# Patient Record
Sex: Female | Born: 1955 | Race: White | Hispanic: No | Smoking: Never smoker
Health system: Southern US, Community
[De-identification: ages and names within clinical notes are randomized; demographics above are authoritative.]

## PROBLEM LIST (undated history)

## (undated) DIAGNOSIS — Z87891 Personal history of nicotine dependence: Secondary | ICD-10-CM

## (undated) DIAGNOSIS — I82409 Acute embolism and thrombosis of unspecified deep veins of unspecified lower extremity: Secondary | ICD-10-CM

## (undated) DIAGNOSIS — I10 Essential (primary) hypertension: Secondary | ICD-10-CM

## (undated) DIAGNOSIS — H539 Unspecified visual disturbance: Secondary | ICD-10-CM

## (undated) DIAGNOSIS — I2510 Atherosclerotic heart disease of native coronary artery without angina pectoris: Secondary | ICD-10-CM

## (undated) DIAGNOSIS — E785 Hyperlipidemia, unspecified: Secondary | ICD-10-CM

## (undated) DIAGNOSIS — Z91199 Patient's noncompliance with other medical treatment and regimen due to unspecified reason: Secondary | ICD-10-CM

## (undated) DIAGNOSIS — D6859 Other primary thrombophilia: Secondary | ICD-10-CM

## (undated) DIAGNOSIS — Z9081 Acquired absence of spleen: Secondary | ICD-10-CM

## (undated) DIAGNOSIS — K316 Fistula of stomach and duodenum: Secondary | ICD-10-CM

## (undated) DIAGNOSIS — K5090 Crohn's disease, unspecified, without complications: Secondary | ICD-10-CM

## (undated) DIAGNOSIS — F331 Major depressive disorder, recurrent, moderate: Secondary | ICD-10-CM

## (undated) DIAGNOSIS — K219 Gastro-esophageal reflux disease without esophagitis: Secondary | ICD-10-CM

## (undated) DIAGNOSIS — Z8619 Personal history of other infectious and parasitic diseases: Secondary | ICD-10-CM

## (undated) DIAGNOSIS — N183 Chronic kidney disease, stage 3 unspecified: Secondary | ICD-10-CM

## (undated) DIAGNOSIS — Z8701 Personal history of pneumonia (recurrent): Secondary | ICD-10-CM

## (undated) DIAGNOSIS — E079 Disorder of thyroid, unspecified: Secondary | ICD-10-CM

## (undated) DIAGNOSIS — Z90710 Acquired absence of both cervix and uterus: Secondary | ICD-10-CM

## (undated) DIAGNOSIS — D509 Iron deficiency anemia, unspecified: Secondary | ICD-10-CM

## (undated) DIAGNOSIS — E039 Hypothyroidism, unspecified: Secondary | ICD-10-CM

## (undated) DIAGNOSIS — Z8719 Personal history of other diseases of the digestive system: Secondary | ICD-10-CM

## (undated) DIAGNOSIS — Z87898 Personal history of other specified conditions: Secondary | ICD-10-CM

## (undated) DIAGNOSIS — Z86711 Personal history of pulmonary embolism: Secondary | ICD-10-CM

## (undated) DIAGNOSIS — D510 Vitamin B12 deficiency anemia due to intrinsic factor deficiency: Secondary | ICD-10-CM

## (undated) HISTORY — DX: Acquired absence of spleen: Z90.81

## (undated) HISTORY — PX: CHOLECYSTECTOMY OPEN: SUR202

## (undated) HISTORY — DX: Fistula of stomach and duodenum: K31.6

## (undated) HISTORY — PX: VEIN LIGATION AND STRIPPING: SHX2653

## (undated) HISTORY — DX: Iron deficiency anemia, unspecified: D50.9

## (undated) HISTORY — DX: Morbid (severe) obesity due to excess calories: E66.01

## (undated) HISTORY — DX: Essential (primary) hypertension: I10

## (undated) HISTORY — PX: TUBAL LIGATION: SHX77

## (undated) HISTORY — DX: Atherosclerotic heart disease of native coronary artery without angina pectoris: I25.10

## (undated) HISTORY — DX: Personal history of pulmonary embolism: Z86.711

## (undated) HISTORY — PX: SLEEVE GASTROPLASTY: SHX1101

## (undated) HISTORY — DX: Personal history of other diseases of the digestive system: Z87.19

## (undated) HISTORY — DX: Personal history of nicotine dependence: Z87.891

## (undated) HISTORY — PX: IVC FILTER INSERTION: CATH118245

## (undated) HISTORY — DX: Acquired absence of both cervix and uterus: Z90.710

## (undated) HISTORY — DX: Other primary thrombophilia: D68.59

## (undated) HISTORY — DX: Hyperlipidemia, unspecified: E78.5

## (undated) HISTORY — DX: Personal history of other specified conditions: Z87.898

## (undated) HISTORY — DX: Chronic kidney disease, stage 3 unspecified: N18.30

## (undated) HISTORY — DX: Unspecified visual disturbance: H53.9

## (undated) HISTORY — DX: Patient's noncompliance with other medical treatment and regimen due to unspecified reason: Z91.199

## (undated) HISTORY — DX: Personal history of other infectious and parasitic diseases: Z86.19

## (undated) HISTORY — PX: ABDOMINAL HYSTERECTOMY: SHX81

## (undated) HISTORY — PX: VASCULAR SURGERY: SHX849

## (undated) HISTORY — DX: Hypothyroidism, unspecified: E03.9

## (undated) HISTORY — DX: Acute embolism and thrombosis of unspecified deep veins of unspecified lower extremity: I82.409

## (undated) HISTORY — PX: CHOLECYSTECTOMY: SHX55

## (undated) HISTORY — DX: Major depressive disorder, recurrent, moderate: F33.1

## (undated) HISTORY — DX: Personal history of pneumonia (recurrent): Z87.01

## (undated) HISTORY — DX: Vitamin B12 deficiency anemia due to intrinsic factor deficiency: D51.0

---

## 2011-09-03 ENCOUNTER — Emergency Department (HOSPITAL_COMMUNITY)
Admission: EM | Admit: 2011-09-03 | Discharge: 2011-09-03 | Disposition: A | Discharge status: Home / Self Care | Payer: Medicare Other | Attending: Emergency Medicine | Admitting: Emergency Medicine

## 2011-09-03 ENCOUNTER — Emergency Department (HOSPITAL_COMMUNITY): Payer: Medicare Other

## 2011-09-03 DIAGNOSIS — M719 Bursopathy, unspecified: Secondary | ICD-10-CM | POA: Insufficient documentation

## 2011-09-03 DIAGNOSIS — M67919 Unspecified disorder of synovium and tendon, unspecified shoulder: Secondary | ICD-10-CM | POA: Insufficient documentation

## 2011-09-03 DIAGNOSIS — K5090 Crohn's disease, unspecified, without complications: Secondary | ICD-10-CM | POA: Insufficient documentation

## 2011-09-03 DIAGNOSIS — M329 Systemic lupus erythematosus, unspecified: Secondary | ICD-10-CM | POA: Insufficient documentation

## 2011-09-03 DIAGNOSIS — M069 Rheumatoid arthritis, unspecified: Secondary | ICD-10-CM | POA: Insufficient documentation

## 2011-09-03 DIAGNOSIS — M25519 Pain in unspecified shoulder: Secondary | ICD-10-CM | POA: Insufficient documentation

## 2011-10-08 ENCOUNTER — Other Ambulatory Visit: Payer: Self-pay | Admitting: Family Medicine

## 2011-10-08 DIAGNOSIS — R911 Solitary pulmonary nodule: Secondary | ICD-10-CM

## 2011-10-10 ENCOUNTER — Ambulatory Visit
Admission: RE | Admit: 2011-10-10 | Discharge: 2011-10-10 | Disposition: A | Discharge status: Home / Self Care | Payer: Medicare Other | Source: Ambulatory Visit | Attending: Family Medicine | Admitting: Family Medicine

## 2011-10-10 DIAGNOSIS — R911 Solitary pulmonary nodule: Secondary | ICD-10-CM

## 2011-10-10 MED ORDER — IOHEXOL 300 MG/ML  SOLN
75.0000 mL | Freq: Once | INTRAMUSCULAR | Status: AC | PRN
  Administered 2011-10-10: 75 mL via INTRAVENOUS

## 2011-12-02 ENCOUNTER — Other Ambulatory Visit: Payer: Self-pay | Admitting: Gastroenterology

## 2011-12-02 DIAGNOSIS — R197 Diarrhea, unspecified: Secondary | ICD-10-CM

## 2011-12-06 ENCOUNTER — Ambulatory Visit
Admission: RE | Admit: 2011-12-06 | Discharge: 2011-12-06 | Disposition: A | Discharge status: Home / Self Care | Payer: Medicare Other | Source: Ambulatory Visit | Attending: Gastroenterology | Admitting: Gastroenterology

## 2011-12-06 DIAGNOSIS — R197 Diarrhea, unspecified: Secondary | ICD-10-CM

## 2011-12-06 MED ORDER — IOHEXOL 300 MG/ML  SOLN
125.0000 mL | Freq: Once | INTRAMUSCULAR | Status: AC | PRN
  Administered 2011-12-06: 125 mL via INTRAVENOUS

## 2012-04-19 ENCOUNTER — Encounter (HOSPITAL_COMMUNITY): Payer: Self-pay

## 2012-04-19 ENCOUNTER — Emergency Department (HOSPITAL_COMMUNITY): Payer: Medicare Other

## 2012-04-19 ENCOUNTER — Emergency Department (HOSPITAL_COMMUNITY)
Admission: EM | Admit: 2012-04-19 | Discharge: 2012-04-19 | Disposition: A | Discharge status: Home / Self Care | Payer: Medicare Other | Attending: Emergency Medicine | Admitting: Emergency Medicine

## 2012-04-19 DIAGNOSIS — Z79899 Other long term (current) drug therapy: Secondary | ICD-10-CM | POA: Insufficient documentation

## 2012-04-19 DIAGNOSIS — M25562 Pain in left knee: Secondary | ICD-10-CM

## 2012-04-19 DIAGNOSIS — W19XXXA Unspecified fall, initial encounter: Secondary | ICD-10-CM | POA: Insufficient documentation

## 2012-04-19 DIAGNOSIS — E079 Disorder of thyroid, unspecified: Secondary | ICD-10-CM | POA: Insufficient documentation

## 2012-04-19 DIAGNOSIS — K5090 Crohn's disease, unspecified, without complications: Secondary | ICD-10-CM | POA: Insufficient documentation

## 2012-04-19 DIAGNOSIS — K219 Gastro-esophageal reflux disease without esophagitis: Secondary | ICD-10-CM | POA: Insufficient documentation

## 2012-04-19 DIAGNOSIS — S8990XA Unspecified injury of unspecified lower leg, initial encounter: Secondary | ICD-10-CM | POA: Insufficient documentation

## 2012-04-19 HISTORY — DX: Crohn's disease, unspecified, without complications: K50.90

## 2012-04-19 HISTORY — DX: Gastro-esophageal reflux disease without esophagitis: K21.9

## 2012-04-19 HISTORY — DX: Disorder of thyroid, unspecified: E07.9

## 2012-04-19 MED ORDER — TRAMADOL HCL 50 MG PO TABS: 50.0000 mg | ORAL_TABLET | Freq: Four times a day (QID) | ORAL | Status: AC | PRN

## 2012-04-19 NOTE — ED Notes (Signed)
Pt in from home with c/o left knee pain states injured several weeks ago states pain has increased worse on movement

## 2012-04-19 NOTE — ED Notes (Signed)
Pt alert, nad, c/o left knee pain, onset several months ago, states resulted from a slip/fall injury, seen PCP, instructed to f/u with Orthopedist, denies recent trauma or injury, resp even unlabored, skin pwd

## 2012-04-19 NOTE — Discharge Instructions (Signed)
Your x-ray shows some arthritis. I think if you pain continues, you must follow up with orthopedics for further evaluation. Use crutches as needed. Ice your knee several times a day. Ultram for pain. Call as referred for orthopedics appointment.   Knee Pain The knee is the complex joint between your thigh and your lower leg. It is made up of bones, tendons, ligaments, and cartilage. The bones that make up the knee are:  The femur in the thigh.   The tibia and fibula in the lower leg.   The patella or kneecap riding in the groove on the lower femur.  CAUSES  Knee pain is a common complaint with many causes. A few of these causes are:  Injury, such as:   A ruptured ligament or tendon injury.   Torn cartilage.   Medical conditions, such as:   Gout   Arthritis   Infections   Overuse, over training or overdoing a physical activity.  Knee pain can be minor or severe. Knee pain can accompany debilitating injury. Minor knee problems often respond well to self-care measures or get well on their own. More serious injuries may need medical intervention or even surgery. SYMPTOMS The knee is complex. Symptoms of knee problems can vary widely. Some of the problems are:  Pain with movement and weight bearing.   Swelling and tenderness.   Buckling of the knee.   Inability to straighten or extend your knee.   Your knee locks and you cannot straighten it.   Warmth and redness with pain and fever.   Deformity or dislocation of the kneecap.  DIAGNOSIS  Determining what is wrong may be very straight forward such as when there is an injury. It can also be challenging because of the complexity of the knee. Tests to make a diagnosis may include:  Your caregiver taking a history and doing a physical exam.   Routine X-rays can be used to rule out other problems. X-rays will not reveal a cartilage tear. Some injuries of the knee can be diagnosed by:   Arthroscopy a surgical technique by  which a small video camera is inserted through tiny incisions on the sides of the knee. This procedure is used to examine and repair internal knee joint problems. Tiny instruments can be used during arthroscopy to repair the torn knee cartilage (meniscus).   Arthrography is a radiology technique. A contrast liquid is directly injected into the knee joint. Internal structures of the knee joint then become visible on X-ray film.   An MRI scan is a non x-ray radiology procedure in which magnetic fields and a computer produce two- or three-dimensional images of the inside of the knee. Cartilage tears are often visible using an MRI scanner. MRI scans have largely replaced arthrography in diagnosing cartilage tears of the knee.   Blood work.   Examination of the fluid that helps to lubricate the knee joint (synovial fluid). This is done by taking a sample out using a needle and a syringe.  TREATMENT The treatment of knee problems depends on the cause. Some of these treatments are:  Depending on the injury, proper casting, splinting, surgery or physical therapy care will be needed.   Give yourself adequate recovery time. Do not overuse your joints. If you begin to get sore during workout routines, back off. Slow down or do fewer repetitions.   For repetitive activities such as cycling or running, maintain your strength and nutrition.   Alternate muscle groups. For example if you are  a weight lifter, work the upper body on one day and the lower body the next.   Either tight or weak muscles do not give the proper support for your knee. Tight or weak muscles do not absorb the stress placed on the knee joint. Keep the muscles surrounding the knee strong.   Take care of mechanical problems.   If you have flat feet, orthotics or special shoes may help. See your caregiver if you need help.   Arch supports, sometimes with wedges on the inner or outer aspect of the heel, can help. These can shift pressure  away from the side of the knee most bothered by osteoarthritis.   A brace called an "unloader" brace also may be used to help ease the pressure on the most arthritic side of the knee.   If your caregiver has prescribed crutches, braces, wraps or ice, use as directed. The acronym for this is PRICE. This means protection, rest, ice, compression and elevation.   Nonsteroidal anti-inflammatory drugs (NSAID's), can help relieve pain. But if taken immediately after an injury, they may actually increase swelling. Take NSAID's with food in your stomach. Stop them if you develop stomach problems. Do not take these if you have a history of ulcers, stomach pain or bleeding from the bowel. Do not take without your caregiver's approval if you have problems with fluid retention, heart failure, or kidney problems.   For ongoing knee problems, physical therapy may be helpful.   Glucosamine and chondroitin are over-the-counter dietary supplements. Both may help relieve the pain of osteoarthritis in the knee. These medicines are different from the usual anti-inflammatory drugs. Glucosamine may decrease the rate of cartilage destruction.   Injections of a corticosteroid drug into your knee joint may help reduce the symptoms of an arthritis flare-up. They may provide pain relief that lasts a few months. You may have to wait a few months between injections. The injections do have a small increased risk of infection, water retention and elevated blood sugar levels.   Hyaluronic acid injected into damaged joints may ease pain and provide lubrication. These injections may work by reducing inflammation. A series of shots may give relief for as long as 6 months.   Topical painkillers. Applying certain ointments to your skin may help relieve the pain and stiffness of osteoarthritis. Ask your pharmacist for suggestions. Many over the-counter products are approved for temporary relief of arthritis pain.   In some countries,  doctors often prescribe topical NSAID's for relief of chronic conditions such as arthritis and tendinitis. A review of treatment with NSAID creams found that they worked as well as oral medications but without the serious side effects.  PREVENTION  Maintain a healthy weight. Extra pounds put more strain on your joints.   Get strong, stay limber. Weak muscles are a common cause of knee injuries. Stretching is important. Include flexibility exercises in your workouts.   Be smart about exercise. If you have osteoarthritis, chronic knee pain or recurring injuries, you may need to change the way you exercise. This does not mean you have to stop being active. If your knees ache after jogging or playing basketball, consider switching to swimming, water aerobics or other low-impact activities, at least for a few days a week. Sometimes limiting high-impact activities will provide relief.   Make sure your shoes fit well. Choose footwear that is right for your sport.   Protect your knees. Use the proper gear for knee-sensitive activities. Use kneepads when playing volleyball  or laying carpet. Buckle your seat belt every time you drive. Most shattered kneecaps occur in car accidents.   Rest when you are tired.  SEEK MEDICAL CARE IF:  You have knee pain that is continual and does not seem to be getting better.  SEEK IMMEDIATE MEDICAL CARE IF:  Your knee joint feels hot to the touch and you have a high fever. MAKE SURE YOU:   Understand these instructions.   Will watch your condition.   Will get help right away if you are not doing well or get worse.  Document Released: 09/15/2007 Document Revised: 11/07/2011 Document Reviewed: 09/15/2007 [facility] Patient Information 2012 [place], [place].

## 2012-04-19 NOTE — ED Provider Notes (Signed)
History     CSN: [id number]  Arrival date & time 04/19/12  1745   First MD Initiated Contact with Patient 04/19/12 1930      Chief Complaint  Patient presents with  . Knee Injury    (Consider location/radiation/quality/duration/timing/severity/associated sxs/prior treatment) Patient is a 56 y.o. female presenting with knee pain. The history is provided by the patient.  Knee Pain This is a new problem. The current episode started more than 1 month ago. The problem has been gradually worsening. Associated symptoms include arthralgias. Pertinent negatives include no chills, fever, joint swelling, numbness or weakness.  Pt states she fell 3 mon ago, states knee twisted and buckled inward. States pain since then. States it is gradually worsening. States she saw her PCP, who told her they were going to refer her to orthopedics. States pain is worsening, now almost unable to walk on left leg.  States she has not heard from her pcp.   Past Medical History  Diagnosis Date  . Crohn's disease   . GERD (gastroesophageal reflux disease)   . Thyroid disease     Past Surgical History  Procedure Date  . Cholecystectomy   . Abdominal hysterectomy   . Vascular surgery     No family history on file.  History  Substance Use Topics  . Smoking status: Never Smoker   . Smokeless tobacco: Not on file  . Alcohol Use: No    OB History    Grav Para Term Preterm Abortions TAB SAB Ect Mult Living                  Review of Systems  Constitutional: Negative for fever and chills.  Respiratory: Negative.   Cardiovascular: Negative.   Musculoskeletal: Positive for arthralgias. Negative for joint swelling.  Skin: Negative.   Neurological: Negative for weakness and numbness.    Allergies  Review of patient's allergies indicates no known allergies.  Home Medications   Current Outpatient Rx  Name Route Sig Dispense Refill  . BUPROPION HCL ER (XL) 300 MG PO TB24 Oral Take 300 mg by mouth  daily.    Marland Kitchen ESCITALOPRAM OXALATE 20 MG PO TABS Oral Take 20 mg by mouth daily.    Marland Kitchen FAMOTIDINE 40 MG PO TABS Oral Take 40 mg by mouth daily.    Marland Kitchen HYDROCHLOROTHIAZIDE 12.5 MG PO TABS Oral Take 25 mg by mouth daily.    Marland Kitchen HYDROXYZINE HCL 50 MG PO TABS Oral Take 50 mg by mouth at bedtime.    . IBUPROFEN 200 MG PO TABS Oral Take 800 mg by mouth every 6 (six) hours as needed. For pain    . OMEPRAZOLE 40 MG PO CPDR Oral Take 40 mg by mouth daily.    . TRAZODONE HCL 50 MG PO TABS Oral Take 50 mg by mouth at bedtime.    . WARFARIN SODIUM 7.5 MG PO TABS Oral Take 7.5 mg by mouth daily.      BP 113/88  Pulse 95  Temp(Src) 98.6 F (37 C) (Oral)  Resp 18  SpO2 97%  Physical Exam  Nursing note and vitals reviewed. Constitutional: She is oriented to person, place, and time. She appears well-developed and well-nourished. No distress.  HENT:  Head: Normocephalic and atraumatic.  Eyes: Conjunctivae are normal.  Neck: Neck supple.  Cardiovascular: Normal rate, regular rhythm and normal heart sounds.   Pulmonary/Chest: Effort normal and breath sounds normal. No respiratory distress.  Abdominal: Soft. Bowel sounds are normal. She exhibits no distension. There  is no tenderness.  Musculoskeletal: Normal range of motion.       Left knee appears normal. Exam limited by body habitus. Patella non tender. Tender over medial joint. Pain with lateral stress. Anterior and posterior drawer signs negative. Limited ROM due to pain.  Neurological: She is alert and oriented to person, place, and time.  Skin: Skin is warm and dry.  Psychiatric: She has a normal mood and affect.    ED Course  Procedures (including critical care time)  No results found for this or any previous visit. Dg Knee Complete 4 Views Left  04/19/2012  *RADIOLOGY REPORT*  Clinical Data: Progressive left knee pain since a fall 3 months ago.  LEFT KNEE - COMPLETE 4+ VIEW  Comparison: None.  Findings: There is no fracture, dislocation, or joint  effusion. Minimal degenerative marginal spur formation in the medial and lateral compartments.  Minimal medial joint space narrowing.  IMPRESSION: No acute abnormalities.  Slight arthritic changes.  Original Report Authenticated By: Gwynn Burly, M.D.    Pt with knee injury several  Months ago, pain worsening. Exam consistent with possible meniscal tear vs ligamentous injury but limited due to pt's habitus. Will d/c home with NSAIDs, pain meds and follow up with orthopedics.      1. Left knee pain       MDM         Lottie Mussel, PA 04/20/12 0130

## 2012-04-20 NOTE — ED Provider Notes (Signed)
Medical screening examination/treatment/procedure(s) were performed by non-physician practitioner and as supervising physician I was immediately available for consultation/collaboration.  Celene Kras, MD 04/20/12 [phone]

## 2013-01-09 IMAGING — CR DG KNEE COMPLETE 4+V*L*
5 series · 5 of 5 positions shown · non-contrast
Comparison: None.

CLINICAL DATA: Progressive left knee pain since a fall 3 months
ago.

LEFT KNEE - COMPLETE 4+ VIEW

[t knee ap left]
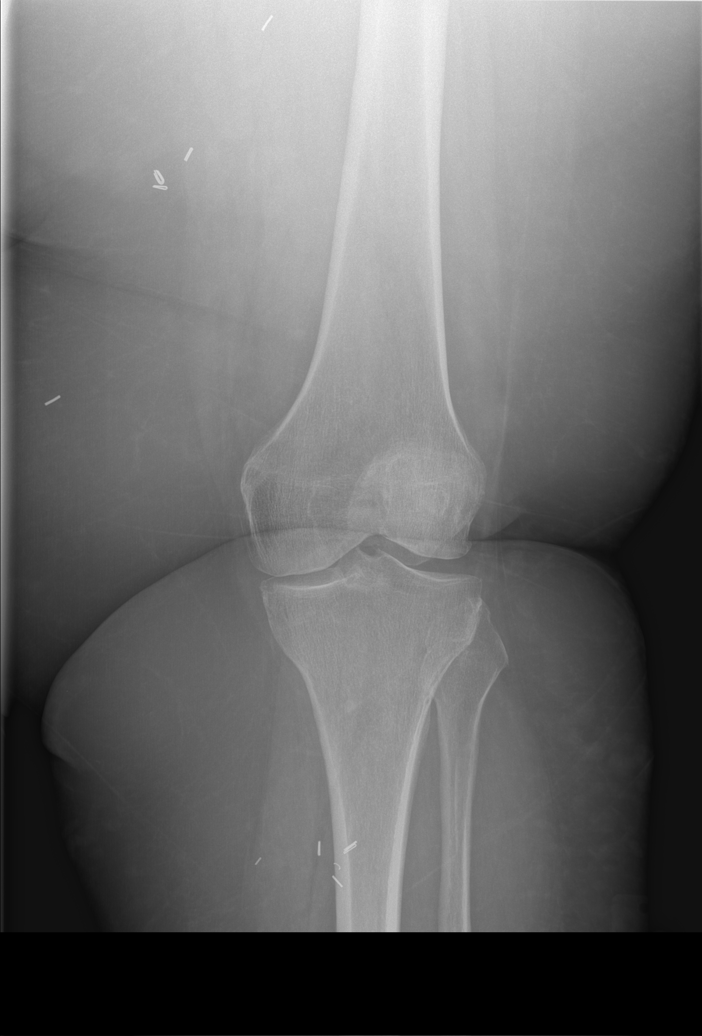

[t knee obl left (1 of 2)]
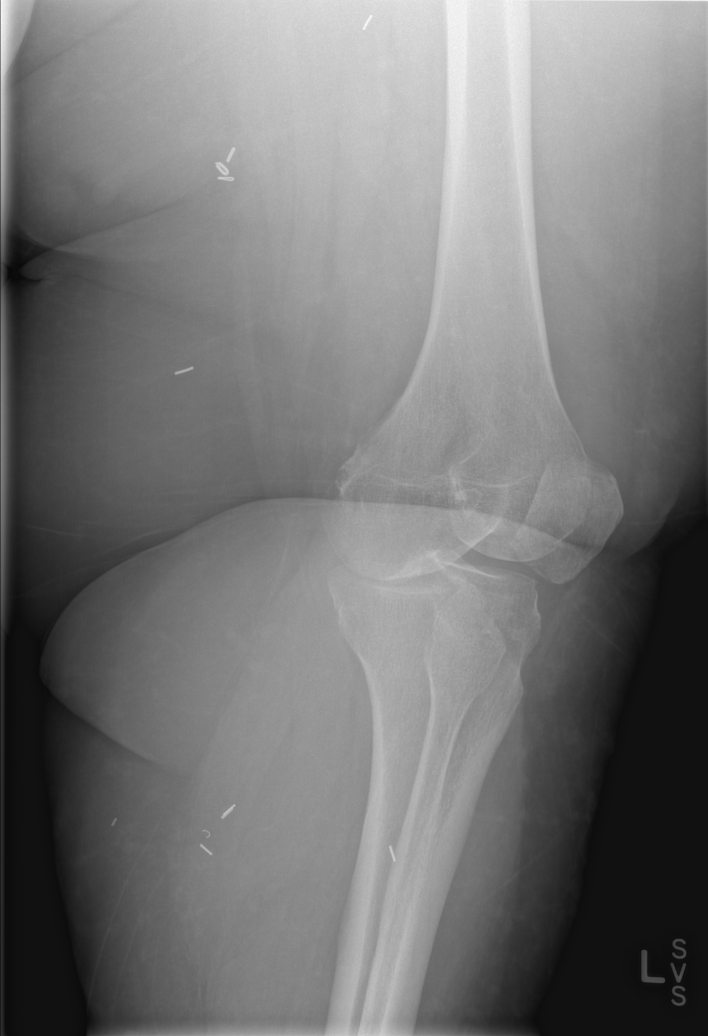

[t knee obl left (2 of 2)]
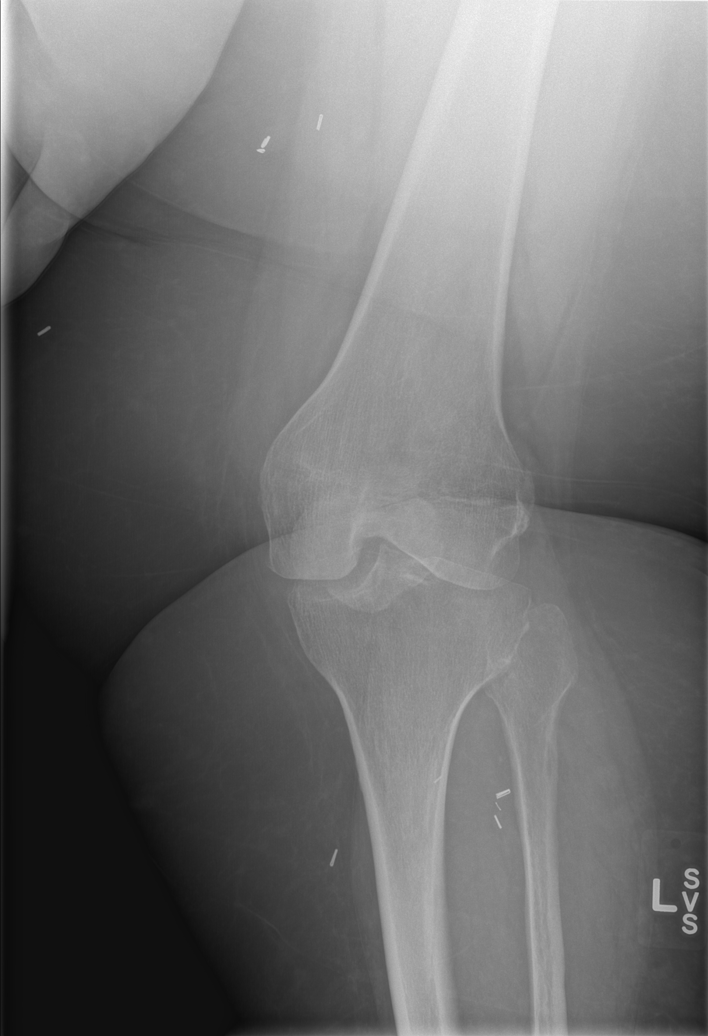

[t knee lat left (1 of 2)]
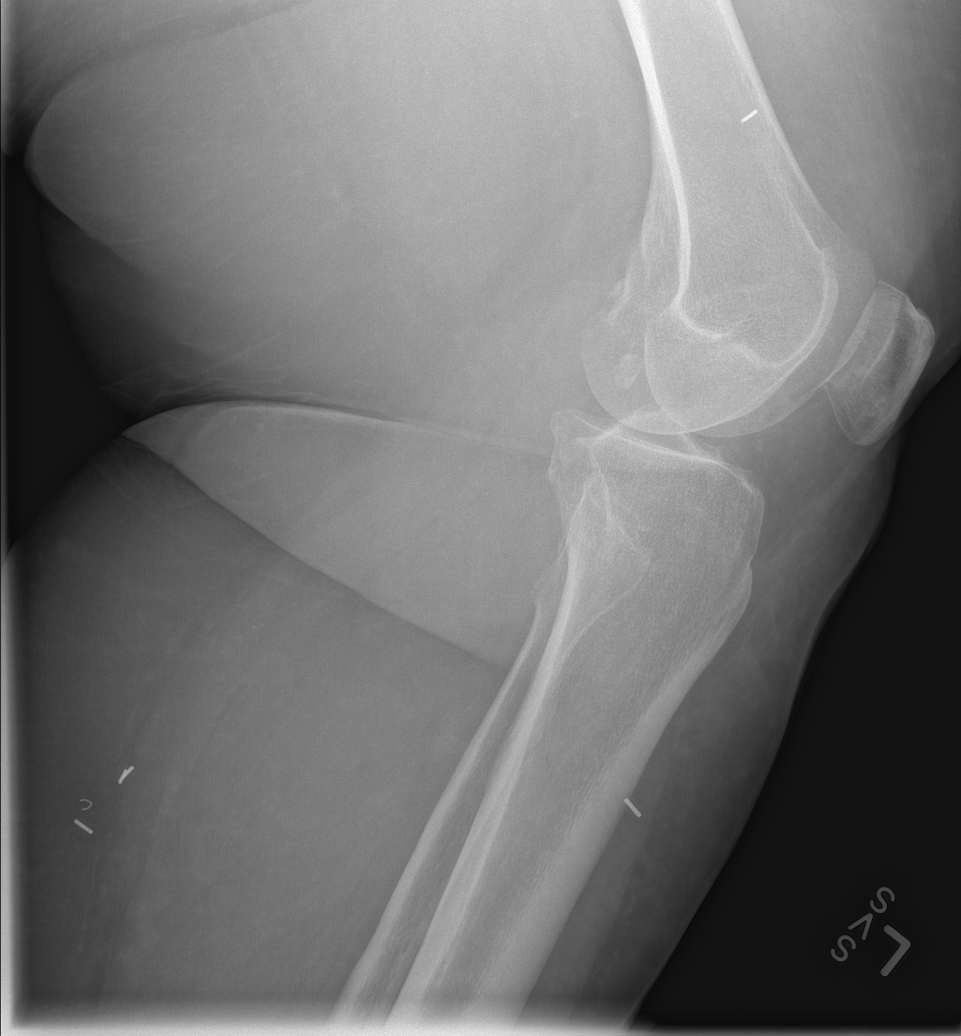

[t knee lat left (2 of 2)]
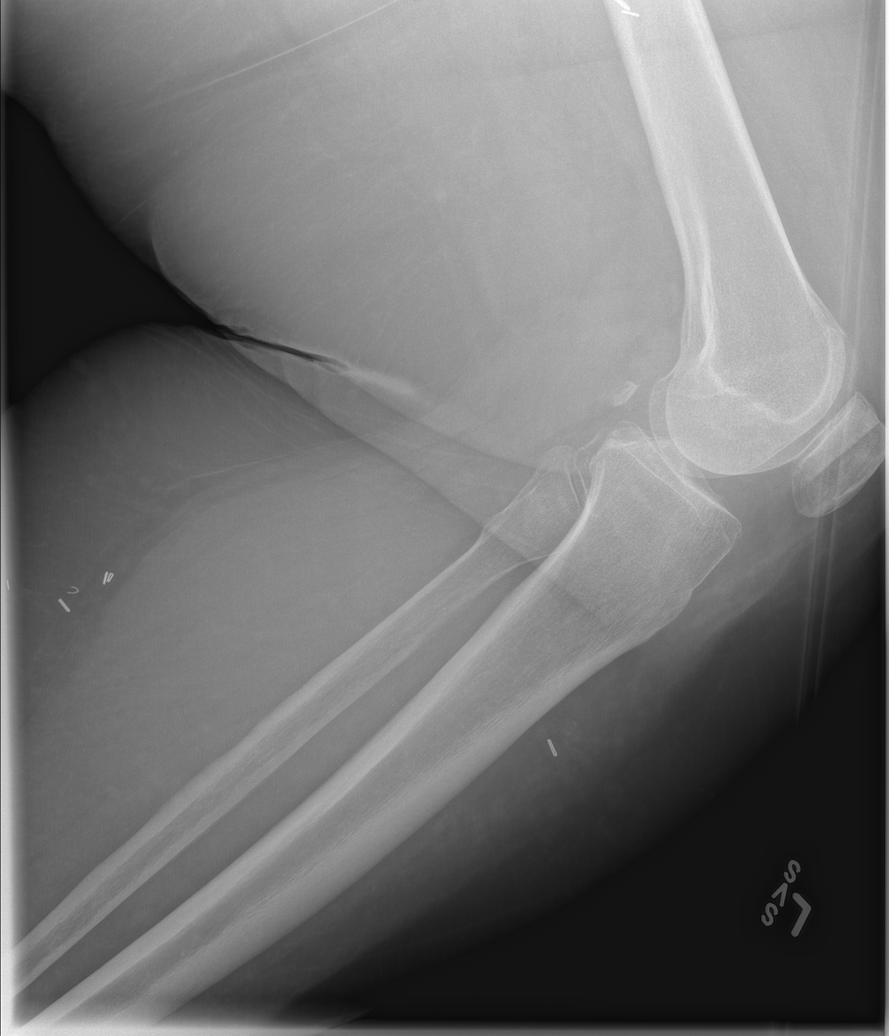

[5 of 5 positions shown; findings below may reference images not displayed]

FINDINGS: There is no fracture, dislocation, or joint effusion.
Minimal degenerative marginal spur formation in the medial and
lateral compartments.  Minimal medial joint space narrowing.
IMPRESSION: No acute abnormalities.  Slight arthritic changes.

## 2015-05-16 ENCOUNTER — Ambulatory Visit: Payer: Medicare Other | Admitting: Neurology

## 2015-05-17 ENCOUNTER — Ambulatory Visit: Payer: Medicare Other | Admitting: Neurology

## 2015-05-17 ENCOUNTER — Telehealth: Payer: Self-pay | Admitting: *Deleted

## 2015-05-17 NOTE — Telephone Encounter (Signed)
No showed new patient appt 

## 2015-05-18 ENCOUNTER — Encounter: Payer: Self-pay | Admitting: Neurology

## 2016-08-22 DIAGNOSIS — D6859 Other primary thrombophilia: Secondary | ICD-10-CM | POA: Insufficient documentation

## 2016-08-22 DIAGNOSIS — K5010 Crohn's disease of large intestine without complications: Secondary | ICD-10-CM | POA: Insufficient documentation

## 2017-06-02 DIAGNOSIS — Z9884 Bariatric surgery status: Secondary | ICD-10-CM | POA: Insufficient documentation

## 2017-11-14 DIAGNOSIS — E039 Hypothyroidism, unspecified: Secondary | ICD-10-CM | POA: Insufficient documentation

## 2017-11-14 DIAGNOSIS — F39 Unspecified mood [affective] disorder: Secondary | ICD-10-CM | POA: Insufficient documentation

## 2018-02-22 DIAGNOSIS — D733 Abscess of spleen: Secondary | ICD-10-CM | POA: Insufficient documentation

## 2018-03-27 DIAGNOSIS — R109 Unspecified abdominal pain: Secondary | ICD-10-CM | POA: Insufficient documentation

## 2018-06-10 ENCOUNTER — Telehealth: Payer: Self-pay | Admitting: *Deleted

## 2018-06-10 NOTE — Telephone Encounter (Signed)
Spoke with pt. and r/s her 06/19/18 appt. with RAS to 06/23/18 at 9am, arrival time 20 min. early, due to provider being out of the office/fim

## 2018-06-19 ENCOUNTER — Ambulatory Visit: Payer: Self-pay | Admitting: Neurology

## 2018-06-23 ENCOUNTER — Encounter: Payer: Self-pay | Admitting: Neurology

## 2018-06-23 ENCOUNTER — Ambulatory Visit: Payer: Medicare HMO | Admitting: Neurology

## 2018-06-23 ENCOUNTER — Other Ambulatory Visit: Payer: Self-pay

## 2018-06-23 VITALS — BP 153/82 | HR 62 | Resp 18 | Ht 63.5 in | Wt 216.0 lb

## 2018-06-23 DIAGNOSIS — G5611 Other lesions of median nerve, right upper limb: Secondary | ICD-10-CM | POA: Diagnosis not present

## 2018-06-23 DIAGNOSIS — R200 Anesthesia of skin: Secondary | ICD-10-CM | POA: Insufficient documentation

## 2018-06-23 DIAGNOSIS — G629 Polyneuropathy, unspecified: Secondary | ICD-10-CM

## 2018-06-23 DIAGNOSIS — G5610 Other lesions of median nerve, unspecified upper limb: Secondary | ICD-10-CM | POA: Insufficient documentation

## 2018-06-23 DIAGNOSIS — G5620 Lesion of ulnar nerve, unspecified upper limb: Secondary | ICD-10-CM | POA: Diagnosis not present

## 2018-06-23 DIAGNOSIS — R011 Cardiac murmur, unspecified: Secondary | ICD-10-CM | POA: Insufficient documentation

## 2018-06-23 NOTE — Progress Notes (Signed)
[facility]  PATIENT: Susan Perry DOB: 11-Sep-1956  REFERRING DOCTOR OR PCP:  DrMarland Kitchen Holley Raring SOURCE: Patient, notes from Dr. Holley Raring.  _________________________________   HISTORICAL  CHIEF COMPLAINT:  Chief Complaint  Patient presents with  . Numbness    Pain/numbness in both feet onset about 2 years ago, progressing up legs to just below the knee in the last few mos.  Numbness, decreased sensitivity to temperature in right 3th ,4th,5th and left 4th, 5th fingers onset about 6 mos. ago. Neck and lower and mid back pain.  Recent dx. of osteoporosis. Hx. of Protein S deficiency, dvt's. 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 Dtr. with MS.  Has not had any imaging or emg/ncv studies/fim    HISTORY OF PRESENT ILLNESS:  I had the pleasure of seeing your patient, Susan Perry, at [facility] for neurologic consultation regarding her numbness and reduced balance  For the past 18-24 months, she has had numbness in her feet.   Numbness is in the sole and all of her toes.   Sometimes she note the numbness above her ankles but not above the knees.    The numbness in the lower leg only occurs once or twice a week, usually in the mornings, and fluctuates quite a bit.   Numbness in her right >> left hand started about 6 months ago.  The most intense numbness is in the 4th and5th fingers and adhjacent palm.   Sometimes, numbness is also in the 3rd finger at time.  Numbness tends to be continuous int eh 4th and 5th fingers on the right but fluctuates elsewhere.     The thumbs are not involved.    Her balance is sometimes off and she sometimes has vertigo.   She denies change in bladder function.   No h/o DM.She has a long history of lower back pain.    She has not had an MRI of the back.or neck and has not had an EMG/NCV.    She has been told she might have RA but the blood test has been negative.     She has a h/o gastric sleeve surgery 01/5637  that was complicated by infection.    She notes the toe numbness started before the surgery.      REVIEW OF SYSTEMS: Constitutional: No fevers, chills, sweats, or change in appetite Eyes: No visual changes, double vision, eye pain Ear, nose and throat: No hearing loss, ear pain, nasal congestion, sore throat Cardiovascular: No chest pain, palpitations Respiratory: No shortness of breath at rest or with exertion.   No wheezes GastrointestinaI: No nausea, vomiting, diarrhea, abdominal pain, fecal incontinence Genitourinary: No dysuria, urinary retention or frequency.  No nocturia. Musculoskeletal: No neck pain, back pain Integumentary: No rash, pruritus, skin lesions Neurological: as above Psychiatric: No depression at this time.  No anxiety Endocrine: No palpitations, diaphoresis, change in appetite, change in weigh or increased thirst Hematologic/Lymphatic: No anemia, purpura, petechiae. Allergic/Immunologic: No itchy/runny eyes, nasal congestion, recent allergic reactions, rashes  ALLERGIES: Allergies  Allergen Reactions  . Trazodone And Nefazodone Other (See Comments)    Other reaction(s): Other - comment required hallucination Pt reports hallucinations     HOME MEDICATIONS:  Current Outpatient Medications:  .  buPROPion (WELLBUTRIN XL) 300 MG 24 hr tablet, Take 300 mg by mouth daily., Disp: , Rfl:  .  escitalopram (LEXAPRO) 20 MG tablet, Take 20 mg by mouth daily., Disp: , Rfl:  .  famotidine (PEPCID) 40 MG  tablet, Take 40 mg by mouth daily., Disp: , Rfl:  .  hydrochlorothiazide (HYDRODIURIL) 12.5 MG tablet, Take 25 mg by mouth daily., Disp: , Rfl:  .  hydrOXYzine (ATARAX/VISTARIL) 50 MG tablet, Take 50 mg by mouth at bedtime., Disp: , Rfl:  .  ibuprofen (ADVIL,MOTRIN) 200 MG tablet, Take 800 mg by mouth every 6 (six) hours as needed. For pain, Disp: , Rfl:  .  omeprazole (PRILOSEC) 40 MG capsule, Take 40 mg by mouth daily., Disp: , Rfl:  .  warfarin (COUMADIN) 7.5 MG  tablet, Take 7.5 mg by mouth daily., Disp: , Rfl:   PAST MEDICAL HISTORY: Past Medical History:  Diagnosis Date  . Crohn's disease ([place])   . Crohn's disease ([place])   . Deep vein thrombosis (DVT) ([place])   . GERD (gastroesophageal reflux disease)   . H/O splenectomy   . H/O total hysterectomy   . Hypertension   . Protein S deficiency ([place])   . Thyroid disease   . Vision abnormalities     PAST SURGICAL HISTORY: Past Surgical History:  Procedure Laterality Date  . ABDOMINAL HYSTERECTOMY    . CHOLECYSTECTOMY    . CHOLECYSTECTOMY OPEN    . IVC FILTER INSERTION    . SLEEVE GASTROPLASTY    . TUBAL LIGATION    . VASCULAR SURGERY    . VEIN LIGATION AND STRIPPING Left     FAMILY HISTORY: Family History  Problem Relation Age of Onset  . Congestive Heart Failure Mother   . Hypertension Mother   . Diabetes type II Mother   . COPD Father   . Breast cancer Sister   . Skin cancer Brother   . Lymphoma Brother     SOCIAL HISTORY:  Social History   Socioeconomic History  . Marital status: Divorced    Spouse name: Not on file  . Number of children: Not on file  . Years of education: Not on file  . Highest education level: Not on file  Occupational History  . Not on file  Social Needs  . Financial resource strain: Not on file  . Food insecurity:    Worry: Not on file    Inability: Not on file  . Transportation needs:    Medical: Not on file    Non-medical: Not on file  Tobacco Use  . Smoking status: Never Smoker  . Smokeless tobacco: Never Used  Substance and Sexual Activity  . Alcohol use: No  . Drug use: No  . Sexual activity: Not on file  Lifestyle  . Physical activity:    Days per week: Not on file    Minutes per session: Not on file  . Stress: Not on file  Relationships  . Social connections:    Talks on phone: Not on file    Gets together: Not on file    Attends religious service: Not on file    Active member of club or organization: Not on file     Attends meetings of clubs or organizations: Not on file    Relationship status: Not on file  . Intimate partner violence:    Fear of current or ex partner: Not on file    Emotionally abused: Not on file    Physically abused: Not on file    Forced sexual activity: Not on file  Other Topics Concern  . Not on file  Social History Narrative  . Not on file     PHYSICAL EXAM  Vitals:   06/23/18 7340  BP: (!) 153/82  Pulse: 62  Resp: 18  Weight: 216 lb (98 kg)  Height: 5' 3.5" (1.613 m)    Body mass index is 37.66 kg/m.   General: The patient is well-developed and well-nourished and in no acute distress.   SOme tenderness in some FMS tender points.    Neck: The neck is supple, no carotid bruits are noted.  The neck is mildly tender with mild reduced ROM  Cardiovascular: The heart has a regular rate and rhythm with a normal S1 and S2. There is a systolic murmur, gallops or rubs. Lungs are clear to auscultation.  Skin: Extremities are without significant edema.  Musculoskeletal:  Back is nontender  Neurologic Exam  Mental status: The patient is alert and oriented x 3 at the time of the examination. The patient has apparent normal recent and remote memory, with an apparently normal attention span and concentration ability.   Speech is normal.  Cranial nerves: Extraocular movements are full. Pupils are equal, round, and reactive to light and accomodation.  Visual fields are full.  Facial symmetry is present. There is good facial sensation to soft touch bilaterally.Facial strength is normal.  Trapezius and sternocleidomastoid strength is normal. No dysarthria is noted.  The tongue is midline, and the patient has symmetric elevation of the soft palate. No obvious hearing deficits are noted.  Motor:  Muscle bulk is normal.   Tone is normal. Strength is  5 / 5 in all 4 extremities except 4-/5 ulnar intrinsic hand muscles on the right and 4+/5 APB (median) on the right.      Strength was  4/5 in the toe extensors, slightly weaker on right  Sensory: Sensory testing is intact to pinprick, soft touch and vibration sensation in the arms except mild decreased sensation in left ulnar hand distribution.    She has reduced vibratrion at the ankles and absent at the toes.   She has reduced pinprick at the mid foot (slightly worse on the right) and absent at toes.    Other:   She has Tinels signs at the right elbow (ulnar) and wrist (median) and slightly at left wrist.    Phalen's sign on the right  Coordination: Cerebellar testing reveals good finger-nose-finger and heel-to-shin bilaterally.  Gait and station: Station is normal.   Gait is normal. Tandem gait is normal. Romberg is negative.   Reflexes: Deep tendon reflexes are symmetric and normal bilaterally.   Plantar responses are flexor.    DIAGNOSTIC DATA (LABS, IMAGING, TESTING) - I reviewed patient records, labs, notes, testing and imaging myself where available.      ASSESSMENT AND PLAN  Polyneuropathy - Plan: NCV with EMG(electromyography), Vitamin B12, Sjogren's syndrome antibods(ssa + ssb), Rheumatoid factor, Multiple Myeloma Panel (SPEP&IFE w/QIG), Copper, serum  Numbness - Plan: NCV with EMG(electromyography), Vitamin B12, Sjogren's syndrome antibods(ssa + ssb), Rheumatoid factor, Multiple Myeloma Panel (SPEP&IFE w/QIG), Copper, serum  Right median nerve neuropathy - Plan: NCV with EMG(electromyography)  Ulnar neuropathy at elbow, unspecified laterality - Plan: NCV with EMG(electromyography)   In summary, Ms. Amsden is a 62 year old woman with numbness in her feet and hands.  I believe she most likely has 2 processes going on.  The numbness in the feet is most likely due to a mild polyneuropathy.  Less likely this could be due to a spinal cord or nerve root disorder.  The symptoms in the hands are most likely due to a right greater than left ulnar neuropathy at the elbow and median  neuropathy at the wrist.  To  better sort these out, we will check a nerve conduction/EMG of the arms and legs.  Additionally, we will check lab work for polyneuropathy.  I will see her when she returns for the NCV/EMG and she should call us back sooner if she has significant new or worsening symptoms.  Thank you for asking me to see Ms. Asher.  Please let me know if I can be of further assistance with her or other patients in the future.   Adonia Porada A. Felecia Shelling, MD, [facility] 2/95/6213, 0:86 AM Certified in Neurology, Clinical Neurophysiology, Sleep Medicine, Pain Medicine and Neuroimaging  [facility] [address], [place], [place] [address] [phone]

## 2018-06-25 LAB — MULTIPLE MYELOMA PANEL, SERUM
ALBUMIN/GLOB SERPL: 0.9 (ref 0.7–1.7)
ALPHA 1: 0.2 g/dL (ref 0.0–0.4)
Albumin SerPl Elph-Mcnc: 3.2 g/dL (ref 2.9–4.4)
Alpha2 Glob SerPl Elph-Mcnc: 0.9 g/dL (ref 0.4–1.0)
B-Globulin SerPl Elph-Mcnc: 1.3 g/dL (ref 0.7–1.3)
GAMMA GLOB SERPL ELPH-MCNC: 1.2 g/dL (ref 0.4–1.8)
Globulin, Total: 3.6 g/dL (ref 2.2–3.9)
IGA/IMMUNOGLOBULIN A, SERUM: 458 mg/dL — AB (ref 87–352)
IGG (IMMUNOGLOBIN G), SERUM: 1268 mg/dL (ref 700–1600)
IgM (Immunoglobulin M), Srm: 82 mg/dL (ref 26–217)
Total Protein: 6.8 g/dL (ref 6.0–8.5)

## 2018-06-25 LAB — SJOGREN'S SYNDROME ANTIBODS(SSA + SSB)
ENA SSA (RO) Ab: <0.2 AI (ref 0.0–0.9)
ENA SSB (LA) Ab: <0.2 AI (ref 0.0–0.9)

## 2018-06-25 LAB — VITAMIN B12: VITAMIN B 12: 291 pg/mL (ref 232–1245)

## 2018-06-25 LAB — RHEUMATOID FACTOR: Rhuematoid fact SerPl-aCnc: <10 IU/mL (ref 0.0–13.9)

## 2018-06-25 LAB — COPPER, SERUM: COPPER: 96 ug/dL (ref 72–166)

## 2018-06-26 ENCOUNTER — Telehealth: Payer: Self-pay | Admitting: *Deleted

## 2018-06-26 NOTE — Telephone Encounter (Signed)
-----   Message from Britt Bottom, MD sent at 06/25/2018  6:35 PM EDT ----- Please let her know that the lab work looked good.  I will see her back when she comes in for the nerve conduction study/EMG

## 2018-06-26 NOTE — Telephone Encounter (Signed)
Spoke with pt. and advised labs done in our office are fine; RAS will see her back for EMG/NCV.  She verbalized understanding of same/fim

## 2018-08-04 ENCOUNTER — Telehealth: Payer: Self-pay | Admitting: Neurology

## 2018-08-04 ENCOUNTER — Encounter: Payer: Medicare HMO | Admitting: Neurology

## 2018-08-04 ENCOUNTER — Ambulatory Visit (INDEPENDENT_AMBULATORY_CARE_PROVIDER_SITE_OTHER): Payer: Medicare HMO | Admitting: Neurology

## 2018-08-04 DIAGNOSIS — G5611 Other lesions of median nerve, right upper limb: Secondary | ICD-10-CM

## 2018-08-04 DIAGNOSIS — G629 Polyneuropathy, unspecified: Secondary | ICD-10-CM | POA: Diagnosis not present

## 2018-08-04 DIAGNOSIS — Z0289 Encounter for other administrative examinations: Secondary | ICD-10-CM

## 2018-08-04 DIAGNOSIS — R200 Anesthesia of skin: Secondary | ICD-10-CM | POA: Diagnosis not present

## 2018-08-04 DIAGNOSIS — G5620 Lesion of ulnar nerve, unspecified upper limb: Secondary | ICD-10-CM

## 2018-08-04 NOTE — Progress Notes (Signed)
[facility]  Full Name: Susan Perry Gender: Female MRN #: [id number] Date of Birth: 08-28-2056    Visit Date: 08/04/18 09:15 Age: 62 Years 50 Months Old Examining Physician: Susan Colt, MD  Referring Physician: Felecia Shelling, MD    HISTORY: Susan Perry is a 62 year old woman numbness in the hands, right greater than left and feet, bilaterally.   NERVE CONDUCTION STUDIES:  The right ulnar, median, tibial and peroneal motor responses had normal distal latencies, amplitudes and conduction velocities.   The right median, ulnar, radial and sural sensory responses had normal peak latencies and amplitudes.  Superficial peroneal sensory response was reduced in amplitude.  EMG STUDIES:  Needle EMG of selected muscles of the right arm and leg was performed.  In the right arm, the deltoid, triceps, biceps, pronator teres and dorsal interosseous muscles had normal motor unit morphology and recruitment.  The right EDC muscle had a few polyphasic motor units but normal recruitment.  In the right leg, the vastus medialis, tibialis anterior, peroneus longus and hamstring muscles were normal.  There were a few polyphasic motor units but normal recruitment in the gastrocnemius and abductor hallucis muscles.  IMPRESSION:  This is essentially a normal NCV/EMG study.  There was no definite evidence of polyneuropathy or significant radiculopathy.  A minimal right S1 chronic radiculopathy cannot be ruled out.   Susan Perry A. Susan Shelling, MD, PhD, FAAN Certified in Neurology, Clinical Neurophysiology, Sleep Medicine, Pain Medicine and Neuroimaging Director, [place] [facility] [address], [place], [place] [address] [phone]  Clinical note: This study did not offer a reasonable explanation for her symptoms.  We will check MRI of the cervical spine and brain to rule out demyelination in the brain or  spinal cord or a compressive myelopathy. --  RAS      MNC    Nerve / Sites Muscle Latency Ref. Amplitude Ref. Rel Amp Segments Distance Velocity Ref. Area    ms ms mV mV %  cm m/s m/s mVms  R Median - APB     Wrist APB 3.2 ?4.4 5.7 ?4.0 100 Wrist - APB 7   18.6     Upper arm APB 6.9  5.6  98.3 Upper arm - Wrist 20 54 ?49 17.6  R Ulnar - ADM     Wrist ADM 2.9 ?3.3 7.0 ?6.0 100 Wrist - ADM 7   21.5     B.Elbow ADM 6.5  6.8  96.2 B.Elbow - Wrist 20 56 ?49 20.9     SusanElbow ADM 8.2  7.2  106 SusanElbow - B.Elbow 10 58 ?49 22.3         SusanElbow - Wrist      R Peroneal - EDB     Ankle EDB 4.5 ?6.5 2.9 ?2.0 100 Ankle - EDB 9   10.7     Fib head EDB 11.0  2.4  82.2 Fib head - Ankle 31 48 ?44 9.3     Pop fossa EDB 12.6  2.7  112 Pop fossa - Fib head 8 51 ?44 10.9         Pop fossa - Ankle      R Tibial - AH     Ankle AH 4.3 ?5.8 4.5 ?4.0 100 Ankle - AH 9   13.8     Pop fossa AH 12.3  3.2  72.3 Pop fossa - Ankle 33 41 ?41 12.0  [place]    Nerve / Sites Rec. Site Peak Lat Ref.  Amp Ref. Segments Distance    ms ms V V  cm  R Radial - Anatomical snuff box (Forearm)     Forearm Wrist 2.6 ?2.9 13 ?15 Forearm - Wrist 10  R Sural - Ankle (Calf)     Calf Ankle 4.1 ?4.4 10 ?6 Calf - Ankle 14  R Superficial peroneal - Ankle     Lat leg Ankle 4.4 ?4.4 2 ?6 Lat leg - Ankle 14  R Median - Orthodromic (Dig II, Mid palm)     Dig II Wrist 3.1 ?3.4 11 ?10 Dig II - Wrist 13  R Ulnar - Orthodromic, (Dig V, Mid palm)     Dig V Wrist 2.9 ?3.1 5 ?5 Dig V - Wrist 56               F  Wave    Nerve F Lat Ref.   ms ms  R Tibial - AH 50.5 ?56.0  R Ulnar - ADM 28.0 ?32.0         EMG full       EMG Summary Table    Spontaneous MUAP Recruitment  Muscle IA Fib PSW Fasc Other Amp Dur. Poly Pattern  R. Deltoid Normal None None None _______ Normal Normal Normal Normal  R. Triceps brachii Normal None None None _______ Normal Normal Normal Normal  R. Biceps brachii Normal None None None _______ Normal  Normal Normal Normal  R. Extensor digitorum communis Normal None None None _______ Normal Normal 1+ Normal  L. Pronator teres Normal None None None _______ Normal Normal Normal Normal  R. First dorsal interosseous Normal None None None _______ Normal Normal Normal Normal  R. Vastus medialis Normal None None None _______ Normal Normal Normal Normal  R. Tibialis anterior Normal None None None _______ Normal Normal Normal Normal  R. Peroneus longus Normal None None None _______ Normal Normal Normal Normal  R. Gastrocnemius (Medial head) Normal None None None _______ Normal Normal 1+ Normal  R. Dorsal interossei (pedis) Normal None None None _______ Normal Normal Normal Normal  R. Abductor hallucis Normal None None None _______ Normal Normal 1+ Reduced  R. Biceps femoris (long head) Normal None None None _______ Normal Normal Normal Normal

## 2018-08-04 NOTE — Telephone Encounter (Signed)
[facility] pending faxed clinical notes  °

## 2018-08-05 NOTE — Telephone Encounter (Signed)
Susan Perry: [id number] (exp. 08/05/18 to 09/03/18) order sent to GI. They will reach out to the pt to schedule.

## 2018-08-15 ENCOUNTER — Ambulatory Visit
Admission: RE | Admit: 2018-08-15 | Discharge: 2018-08-15 | Disposition: A | Discharge status: Home / Self Care | Payer: Medicare HMO | Source: Ambulatory Visit | Attending: Neurology | Admitting: Neurology

## 2018-08-15 DIAGNOSIS — R200 Anesthesia of skin: Secondary | ICD-10-CM

## 2018-08-15 MED ORDER — GADOBENATE DIMEGLUMINE 529 MG/ML IV SOLN
20.0000 mL | Freq: Once | INTRAVENOUS | Status: AC | PRN
  Administered 2018-08-15: 20 mL via INTRAVENOUS

## 2018-08-18 ENCOUNTER — Telehealth: Payer: Self-pay | Admitting: *Deleted

## 2018-08-18 NOTE — Telephone Encounter (Signed)
Spoke with Sharyn Lull and reviewed below MRI reports. She verbalized understanding of same/fim

## 2018-08-18 NOTE — Telephone Encounter (Signed)
-----   Message from Britt Bottom, MD sent at 08/17/2018  6:59 PM EDT ----- Please let her know that the MRI of the brain does not show any MS spots.  She does have some changes but they appear to be age-related and not MS.  The MRI of the cervical spine show that her spinal cord was normal and she has some mild arthritic/degenerative changes but nothing too bad.

## 2018-08-24 DIAGNOSIS — R109 Unspecified abdominal pain: Secondary | ICD-10-CM | POA: Diagnosis not present

## 2018-08-24 DIAGNOSIS — E860 Dehydration: Secondary | ICD-10-CM

## 2018-08-24 DIAGNOSIS — D473 Essential (hemorrhagic) thrombocythemia: Secondary | ICD-10-CM | POA: Diagnosis not present

## 2018-08-24 DIAGNOSIS — K56609 Unspecified intestinal obstruction, unspecified as to partial versus complete obstruction: Secondary | ICD-10-CM

## 2018-08-24 DIAGNOSIS — R112 Nausea with vomiting, unspecified: Secondary | ICD-10-CM | POA: Diagnosis not present

## 2018-08-25 DIAGNOSIS — R109 Unspecified abdominal pain: Secondary | ICD-10-CM | POA: Diagnosis not present

## 2018-08-25 DIAGNOSIS — D473 Essential (hemorrhagic) thrombocythemia: Secondary | ICD-10-CM | POA: Diagnosis not present

## 2018-08-25 DIAGNOSIS — E860 Dehydration: Secondary | ICD-10-CM | POA: Diagnosis not present

## 2018-08-25 DIAGNOSIS — R112 Nausea with vomiting, unspecified: Secondary | ICD-10-CM | POA: Diagnosis not present

## 2018-08-26 DIAGNOSIS — A0472 Enterocolitis due to Clostridium difficile, not specified as recurrent: Secondary | ICD-10-CM

## 2018-08-27 DIAGNOSIS — A0472 Enterocolitis due to Clostridium difficile, not specified as recurrent: Secondary | ICD-10-CM | POA: Diagnosis not present

## 2018-08-28 DIAGNOSIS — A0472 Enterocolitis due to Clostridium difficile, not specified as recurrent: Secondary | ICD-10-CM | POA: Diagnosis not present

## 2018-08-29 DIAGNOSIS — A0472 Enterocolitis due to Clostridium difficile, not specified as recurrent: Secondary | ICD-10-CM | POA: Diagnosis not present

## 2018-08-30 DIAGNOSIS — A0472 Enterocolitis due to Clostridium difficile, not specified as recurrent: Secondary | ICD-10-CM | POA: Diagnosis not present

## 2018-08-31 DIAGNOSIS — A0472 Enterocolitis due to Clostridium difficile, not specified as recurrent: Secondary | ICD-10-CM | POA: Diagnosis not present

## 2018-09-15 ENCOUNTER — Telehealth: Payer: Self-pay | Admitting: Neurology

## 2018-09-15 NOTE — Telephone Encounter (Signed)
Schedule pt a f/u for 11/13 stating her hand/arm pain has increased. Requesting a call to discuss something to hold her over till appt.

## 2018-09-16 MED ORDER — GABAPENTIN 100 MG PO CAPS: ORAL_CAPSULE | ORAL | 1 refills | Status: DC

## 2018-09-16 NOTE — Telephone Encounter (Signed)
Spoke with Susan Perry.  EMG, MRI's of brain, c-spine did not show any causes for arm pain.  Low dose Ibuprofen not helping, plus she is on Coumadin. Per RAS, ok to try Gabapentin 100mg -100mg -200mg , as tolerated.  I cautioned pt. against possible drowsiness assoc. with Gabapentin, and to start with hs dose only for a few nights, add daytime doses if tolerated, and to make these changes when she knew she would not need to drive, in case of excessive drowsiness.   She verbalized understanding of same. Rx. escribed to [facility] per her request/fim

## 2018-10-14 ENCOUNTER — Ambulatory Visit: Payer: Medicare HMO | Admitting: Neurology

## 2018-10-22 ENCOUNTER — Ambulatory Visit: Payer: Medicare HMO | Admitting: Neurology

## 2018-10-22 ENCOUNTER — Encounter: Payer: Self-pay | Admitting: Neurology

## 2018-10-22 VITALS — BP 142/80 | HR 66 | Ht 63.5 in | Wt 219.5 lb

## 2018-10-22 DIAGNOSIS — R29898 Other symptoms and signs involving the musculoskeletal system: Secondary | ICD-10-CM | POA: Diagnosis not present

## 2018-10-22 DIAGNOSIS — M47812 Spondylosis without myelopathy or radiculopathy, cervical region: Secondary | ICD-10-CM | POA: Diagnosis not present

## 2018-10-22 DIAGNOSIS — R200 Anesthesia of skin: Secondary | ICD-10-CM

## 2018-10-22 MED ORDER — GABAPENTIN 300 MG PO CAPS: ORAL_CAPSULE | ORAL | 5 refills | Status: DC

## 2018-10-22 NOTE — Progress Notes (Signed)
[facility]  PATIENT: Susan Perry DOB: February 07, 1956  REFERRING DOCTOR OR PCP:  DrMarland Kitchen Holley Raring SOURCE: Patient, notes from Dr. Holley Raring.  _________________________________   HISTORICAL  CHIEF COMPLAINT:  Chief Complaint  Patient presents with  . Follow-up    RM 12, alone. Last seen 08/04/18 for EMG/NCS.     HISTORY OF PRESENT ILLNESS:  Susan Perry is a 62 y.o. woman with hand > foot numbness and reduced balance  Update 10/22/2018: She has numbness in the right > left hand > foot numbness.    Gabapentin has helped the dysesthetic quality some.   She is on gabapentin 100-100-200.   The left arm, ulnar distribution > median, is more constant numbness and pain and the left arm symptoms come and go.  Cold worsens the symptoms some.    Her right arm is steong but the left shoulder seems week x 6 months or so and she can't push up with her left arm and she has proximal arm pain when she tries to raise the arm.    NCV/EMG was essentially normal without evidence of polyneuropathy, mononeuropathy or significant radiculopathy.   MRI, personally reviewed, of the brain and C-spine was normal for age.   The brain showed mild chronic microvascular ischemic changes.  Cervical spine showed mild multilevel degenerative change.  There is no spinal stenosis or nerve root compression.  From 06/23/2018: For the past 18-24 months, she has had numbness in her feet.   Numbness is in the sole and all of her toes.   Sometimes she note the numbness above her ankles but not above the knees.    The numbness in the lower leg only occurs once or twice a week, usually in the mornings, and fluctuates quite a bit.   Numbness in her right >> left hand started about 6 months ago.  The most intense numbness is in the 4th and5th fingers and adhjacent palm.   Sometimes, numbness is also in the 3rd finger at time.  Numbness tends to be continuous int eh 4th and 5th fingers on the right but fluctuates  elsewhere.     The thumbs are not involved.    Her balance is sometimes off and she sometimes has vertigo.   She denies change in bladder function.   No h/o DM.She has a long history of lower back pain.    She has not had an MRI of the back.or neck and has not had an EMG/NCV.    She has been told she might have RA but the blood test has been negative.     She has a h/o gastric sleeve surgery 03/346 that was complicated by infection.    She notes the toe numbness started before the surgery.      REVIEW OF SYSTEMS: Constitutional: No fevers, chills, sweats, or change in appetite Eyes: No visual changes, double vision, eye pain Ear, nose and throat: No hearing loss, ear pain, nasal congestion, sore throat Cardiovascular: No chest pain, palpitations Respiratory: No shortness of breath at rest or with exertion.   No wheezes GastrointestinaI: No nausea, vomiting, diarrhea, abdominal pain, fecal incontinence Genitourinary: No dysuria, urinary retention or frequency.  No nocturia. Musculoskeletal: No neck pain, back pain Integumentary: No rash, pruritus, skin lesions Neurological: as above Psychiatric: No depression at this time.  No anxiety Endocrine: No palpitations, diaphoresis, change in appetite, change in weigh or increased thirst Hematologic/Lymphatic: No anemia, purpura, petechiae. Allergic/Immunologic: No itchy/runny eyes, nasal congestion, recent allergic reactions, rashes  ALLERGIES: Allergies  Allergen Reactions  . Trazodone And Nefazodone Other (See Comments)    Other reaction(s): Other - comment required hallucination Pt reports hallucinations     HOME MEDICATIONS:  Current Outpatient Medications:  .  buPROPion (WELLBUTRIN XL) 300 MG 24 hr tablet, Take 300 mg by mouth daily., Disp: , Rfl:  .  escitalopram (LEXAPRO) 20 MG tablet, Take 20 mg by mouth daily., Disp: , Rfl:  .  famotidine (PEPCID) 40 MG tablet, Take 40 mg by mouth daily., Disp: , Rfl:  .  gabapentin  (NEURONTIN) 300 MG capsule, Take 1 tablet in the morning, 1 in the afternoon, and 2 at bedtime, as tolerated, for neuropathic pain, Disp: 120 capsule, Rfl: 5 .  hydrochlorothiazide (HYDRODIURIL) 12.5 MG tablet, Take 25 mg by mouth daily., Disp: , Rfl:  .  hydrOXYzine (ATARAX/VISTARIL) 50 MG tablet, Take 50 mg by mouth at bedtime., Disp: , Rfl:  .  ibuprofen (ADVIL,MOTRIN) 200 MG tablet, Take 800 mg by mouth every 6 (six) hours as needed. For pain, Disp: , Rfl:  .  omeprazole (PRILOSEC) 40 MG capsule, Take 40 mg by mouth daily., Disp: , Rfl:  .  warfarin (COUMADIN) 7.5 MG tablet, Take 7.5 mg by mouth daily., Disp: , Rfl:   PAST MEDICAL HISTORY: Past Medical History:  Diagnosis Date  . Crohn's disease ([place])   . Crohn's disease ([place])   . Deep vein thrombosis (DVT) ([place])   . GERD (gastroesophageal reflux disease)   . H/O splenectomy   . H/O total hysterectomy   . Hypertension   . Protein S deficiency ([place])   . Thyroid disease   . Vision abnormalities     PAST SURGICAL HISTORY: Past Surgical History:  Procedure Laterality Date  . ABDOMINAL HYSTERECTOMY    . CHOLECYSTECTOMY    . CHOLECYSTECTOMY OPEN    . IVC FILTER INSERTION    . SLEEVE GASTROPLASTY    . TUBAL LIGATION    . VASCULAR SURGERY    . VEIN LIGATION AND STRIPPING Left     FAMILY HISTORY: Family History  Problem Relation Age of Onset  . Congestive Heart Failure Mother   . Hypertension Mother   . Diabetes type II Mother   . COPD Father   . Breast cancer Sister   . Skin cancer Brother   . Lymphoma Brother     SOCIAL HISTORY:  Social History   Socioeconomic History  . Marital status: Divorced    Spouse name: Not on file  . Number of children: Not on file  . Years of education: Not on file  . Highest education level: Not on file  Occupational History  . Not on file  Social Needs  . Financial resource strain: Not on file  . Food insecurity:    Worry: Not on file    Inability: Not on file  . Transportation  needs:    Medical: Not on file    Non-medical: Not on file  Tobacco Use  . Smoking status: Never Smoker  . Smokeless tobacco: Never Used  Substance and Sexual Activity  . Alcohol use: No  . Drug use: No  . Sexual activity: Not on file  Lifestyle  . Physical activity:    Days per week: Not on file    Minutes per session: Not on file  . Stress: Not on file  Relationships  . Social connections:    Talks on phone: Not on file    Gets together: Not on file  Attends religious service: Not on file    Active member of club or organization: Not on file    Attends meetings of clubs or organizations: Not on file    Relationship status: Not on file  . Intimate partner violence:    Fear of current or ex partner: Not on file    Emotionally abused: Not on file    Physically abused: Not on file    Forced sexual activity: Not on file  Other Topics Concern  . Not on file  Social History Narrative  . Not on file     PHYSICAL EXAM  Vitals:   10/22/18 0901  BP: (!) 142/80  Pulse: 66  Weight: 219 lb 8 oz (99.6 kg)  Height: 5' 3.5" (1.613 m)    Body mass index is 38.27 kg/m.   General: The patient is well-developed and well-nourished and in no acute distress.   SOme tenderness in some FMS tender points.    Neck: The neck is mildly tender with a mildly reduced range of motion.   Skin: Extremities are without rash.  She has 1+ pedal edema.  Musculoskeletal:  Back is nontender  Neurologic Exam  Mental status: The patient is alert and oriented x 3 at the time of the examination. The patient has apparent normal recent and remote memory, with an apparently normal attention span and concentration ability.   Speech is normal.  Cranial nerves: Extraocular movements are full.  Facial strength and sensation was more normal.  Trapezius strength was slightly reduced on the left.  No obvious hearing deficits are noted.  Motor:  Muscle bulk is normal.   Tone is normal. Strength is  5 / 5  in all 4 extremities except 4+/5 ulnar intrinsic hand muscles on the right and 4+/5 APB (median) on the right.      Strength was 4/5 in the toe extensors, slightly weaker on right.   4/5 strength in the proximal left arm.  Sensory: She has altered sensation in the ulnar distribution of both hands.  There was mild reduced sensation in the toes.  Other:   She no longer has Phalen's or Tinel signs.  Coordination: Cerebellar testing reveals good finger-nose-finger and heel-to-shin bilaterally.  Gait and station: Station is normal.   Gait is normal. Tandem gait is normal. Romberg is negative.   Reflexes: Deep tendon reflexes are symmetric and normal bilaterally.   Plantar responses are flexor.    DIAGNOSTIC DATA (LABS, IMAGING, TESTING) - I reviewed patient records, labs, notes, testing and imaging myself where available.      ASSESSMENT AND PLAN  Numbness  Arm weakness  Spondylosis of cervical region without myelopathy or radiculopathy  1.   Etiology of symptoms is uncertain.   Will try to treat symptoms.  Increase gabapentin to 300-300-600.    2.   If left arm pain worsens consider referral to ortho. 3.   She is advised to call us if her weakness or numbness worsens or she has other neurologic symptoms. 4.    She will return for regular scheduled visit in 4 months  Richard A. Felecia Shelling, MD, [facility] 29/51/8841, 66:06 AM Certified in Neurology, Clinical Neurophysiology, Sleep Medicine, Pain Medicine and Neuroimaging  [facility] [address] [place], [place] [address] [phone]

## 2019-02-23 ENCOUNTER — Telehealth: Payer: Self-pay | Admitting: *Deleted

## 2019-02-23 NOTE — Telephone Encounter (Signed)
Called, LVM for pt to call office back to discuss appt tomorrow.  Due to concerns w/ coronavirus we are cx non-urgent follow ups. Was going to offer telephone visit with Dr. Felecia Shelling if she is agreeable to this.  If she feels she still needs to be seen urgently, we will need to screen her for any sx of coronavirus and make sure she has not been in contact with anyone dx or have traveled recently.

## 2019-02-23 NOTE — Telephone Encounter (Signed)
Spoke to pt and relayed that reducing # persons coming into office.  Converted to [place] per her consent.  Meds and allergies updated.  She stated that the increase of gabapentin did help, although she is taking 2 caps at [facility] at this time.

## 2019-02-23 NOTE — Addendum Note (Signed)
Addended by: Brandon Melnick on: 02/23/2019 03:15 PM   Modules accepted: Orders

## 2019-02-24 ENCOUNTER — Ambulatory Visit (INDEPENDENT_AMBULATORY_CARE_PROVIDER_SITE_OTHER): Payer: Medicare Other | Admitting: Neurology

## 2019-02-24 ENCOUNTER — Other Ambulatory Visit: Payer: Self-pay

## 2019-02-24 ENCOUNTER — Encounter: Payer: Self-pay | Admitting: Neurology

## 2019-02-24 DIAGNOSIS — R200 Anesthesia of skin: Secondary | ICD-10-CM

## 2019-02-24 DIAGNOSIS — G629 Polyneuropathy, unspecified: Secondary | ICD-10-CM | POA: Diagnosis not present

## 2019-02-24 DIAGNOSIS — R29898 Other symptoms and signs involving the musculoskeletal system: Secondary | ICD-10-CM | POA: Diagnosis not present

## 2019-02-24 DIAGNOSIS — M47812 Spondylosis without myelopathy or radiculopathy, cervical region: Secondary | ICD-10-CM | POA: Diagnosis not present

## 2019-02-24 MED ORDER — GABAPENTIN 300 MG PO CAPS: ORAL_CAPSULE | ORAL | 5 refills | Status: DC

## 2019-02-24 NOTE — Progress Notes (Signed)
Virtual Visit via Telephone Note  I connected with Susan Perry on 02/24/19 at 11:30 AM EDT by telephone and verified that I am speaking with the correct person using two identifiers.   I discussed the limitations, risks, security and privacy concerns of performing an evaluation and management service by telephone and the availability of in person appointments. I also discussed with the patient that there may be a patient responsible charge related to this service. The patient expressed understanding and agreed to proceed.   History of Present Illness: She is doing better with less numbness.   Since the last visit, we increase the gabapentin to 300-300 600 mg.   She is just taking 600 mg at night.   She notes the higher dose of gabapentin is helping.   She is stumbling a little bit and she tires out easily.   She has to rest when she walks as her legs seem to weaken.    Her left arm is slightly weaker and she sometimes drops items.  Numbness, however, is worse on her right.         She has numbness in the right > left hand > foot numbness.    Gabapentin has helped the dysesthetic quality some.   She is on gabapentin 100-100-200.   The left arm, ulnar distribution > median, is more constant numbness and pain and the left arm symptoms come and go.  Cold worsens the symptoms some.    Her right arm is steong but the left shoulder seems week x 6 months or so and she can't push up with her left arm and she has proximal arm pain when she tries to raise the arm.    NCV/EMG was essentially normal without evidence of polyneuropathy, mononeuropathy or significant radiculopathy.   MRI, personally reviewed, of the brain and C-spine was normal for age.   The brain showed mild chronic microvascular ischemic changes.  Cervical spine showed mild multilevel degenerative change.  There is no spinal stenosis or nerve root compression.   Observations/Objective: She was alert and oriented.  Speech was normal.   Voice was strong.  Assessment and Plan: Numbness  Polyneuropathy  Spondylosis of cervical region without myelopathy or radiculopathy  Arm weakness   1.  Continue gabapentin.  If pain worsens try to get in 4 pills a day. 2.   Exercise and stay active. 3.   She will return to see me as needed. Follow Up Instructions:    I discussed the assessment and treatment plan with the patient. The patient was provided an opportunity to ask questions and all were answered. The patient agreed with the plan and demonstrated an understanding of the instructions.   The patient was advised to call back or seek an in-person evaluation if the symptoms worsen or if the condition fails to improve as anticipated.  I provided 23 minutes of non-face-to-face time during this encounter.   Britt Bottom, MD,PhD  _____________________________________ From previous visit  [facility]  PATIENT: Susan Perry DOB: [birth year]  REFERRING DOCTOR OR PCP:  DrHolley Raring SOURCE: Patient, notes from Dr. Holley Raring.  _________________________________   HISTORICAL  CHIEF COMPLAINT:  No chief complaint on file.   HISTORY OF PRESENT ILLNESS:  Susan Perry is a 63 y.o. woman with hand > foot numbness and reduced balance  Update 10/22/2018: She has numbness in the right > left hand > foot numbness.    Gabapentin has helped the dysesthetic quality some.   She is on  gabapentin 100-100-200.   The left arm, ulnar distribution > median, is more constant numbness and pain and the left arm symptoms come and go.  Cold worsens the symptoms some.    Her right arm is steong but the left shoulder seems week x 6 months or so and she can't push up with her left arm and she has proximal arm pain when she tries to raise the arm.    NCV/EMG was essentially normal without evidence of polyneuropathy, mononeuropathy or significant radiculopathy.   MRI, personally reviewed, of the brain and C-spine was  normal for age.   The brain showed mild chronic microvascular ischemic changes.  Cervical spine showed mild multilevel degenerative change.  There is no spinal stenosis or nerve root compression.  From 06/23/2018: For the past 18-24 months, she has had numbness in her feet.   Numbness is in the sole and all of her toes.   Sometimes she note the numbness above her ankles but not above the knees.    The numbness in the lower leg only occurs once or twice a week, usually in the mornings, and fluctuates quite a bit.   Numbness in her right >> left hand started about 6 months ago.  The most intense numbness is in the 4th and5th fingers and adhjacent palm.   Sometimes, numbness is also in the 3rd finger at time.  Numbness tends to be continuous int eh 4th and 5th fingers on the right but fluctuates elsewhere.     The thumbs are not involved.    Her balance is sometimes off and she sometimes has vertigo.   She denies change in bladder function.   No h/o DM.She has a long history of lower back pain.    She has not had an MRI of the back.or neck and has not had an EMG/NCV.    She has been told she might have RA but the blood test has been negative.     She has a h/o gastric sleeve surgery 07/2504 that was complicated by infection.    She notes the toe numbness started before the surgery.      REVIEW OF SYSTEMS: Constitutional: No fevers, chills, sweats, or change in appetite Eyes: No visual changes, double vision, eye pain Ear, nose and throat: No hearing loss, ear pain, nasal congestion, sore throat Cardiovascular: No chest pain, palpitations Respiratory: No shortness of breath at rest or with exertion.   No wheezes GastrointestinaI: No nausea, vomiting, diarrhea, abdominal pain, fecal incontinence Genitourinary: No dysuria, urinary retention or frequency.  No nocturia. Musculoskeletal: No neck pain, back pain Integumentary: No rash, pruritus, skin lesions Neurological: as above Psychiatric: No  depression at this time.  No anxiety Endocrine: No palpitations, diaphoresis, change in appetite, change in weigh or increased thirst Hematologic/Lymphatic: No anemia, purpura, petechiae. Allergic/Immunologic: No itchy/runny eyes, nasal congestion, recent allergic reactions, rashes  ALLERGIES: Allergies  Allergen Reactions  . Trazodone And Nefazodone Other (See Comments)    Other reaction(s): Other - comment required hallucination Pt reports hallucinations   . Lisinopril     HOME MEDICATIONS:  Current Outpatient Medications:  .  buPROPion (WELLBUTRIN XL) 300 MG 24 hr tablet, Take 300 mg by mouth daily., Disp: , Rfl:  .  escitalopram (LEXAPRO) 20 MG tablet, Take 20 mg by mouth daily., Disp: , Rfl:  .  famotidine (PEPCID) 40 MG tablet, Take 40 mg by mouth daily., Disp: , Rfl:  .  gabapentin (NEURONTIN) 300 MG capsule, Take 1 tablet in  the morning, 1 in the afternoon, and 2 at bedtime, as tolerated, for neuropathic pain, Disp: 120 capsule, Rfl: 5 .  HYDROcodone-acetaminophen (NORCO) 10-325 MG tablet, Take 1-2 tablets by mouth every 4 (four) hours as needed. , Disp: , Rfl:  .  hydrOXYzine (ATARAX/VISTARIL) 50 MG tablet, Take 50 mg by mouth daily as needed. , Disp: , Rfl:  .  levothyroxine (SYNTHROID, LEVOTHROID) 75 MCG tablet, Take 225 mcg by mouth daily before breakfast. , Disp: , Rfl:  .  LINZESS 72 MCG capsule, Take 72 mcg by mouth daily before breakfast. , Disp: , Rfl:  .  losartan-hydrochlorothiazide (HYZAAR) 100-25 MG tablet, 1 tablet daily. , Disp: , Rfl:  .  NARCAN 4 MG/0.1ML LIQD nasal spray kit, Place 1 spray into the nose. , Disp: , Rfl:  .  omeprazole (PRILOSEC) 40 MG capsule, Take 40 mg by mouth daily., Disp: , Rfl:  .  Potassium Chloride ER 20 MEQ TBCR, Take 1 tablet by mouth daily., Disp: , Rfl:  .  traMADol (ULTRAM) 50 MG tablet, Take 50 mg by mouth every 6 (six) hours as needed. , Disp: , Rfl:  .  valACYclovir (VALTREX) 1000 MG tablet, as needed. For outbreaks., Disp: ,  Rfl:  .  warfarin (COUMADIN) 7.5 MG tablet, Take 7.5 mg by mouth daily. 7.5 - 10 scale, Disp: , Rfl:   PAST MEDICAL HISTORY: Past Medical History:  Diagnosis Date  . Crohn's disease ([place])   . Crohn's disease ([place])   . Deep vein thrombosis (DVT) ([place])   . GERD (gastroesophageal reflux disease)   . H/O splenectomy   . H/O total hysterectomy   . Hypertension   . Protein S deficiency ([place])   . Thyroid disease   . Vision abnormalities     PAST SURGICAL HISTORY: Past Surgical History:  Procedure Laterality Date  . ABDOMINAL HYSTERECTOMY    . CHOLECYSTECTOMY    . CHOLECYSTECTOMY OPEN    . IVC FILTER INSERTION    . SLEEVE GASTROPLASTY    . TUBAL LIGATION    . VASCULAR SURGERY    . VEIN LIGATION AND STRIPPING Left     FAMILY HISTORY: Family History  Problem Relation Age of Onset  . Congestive Heart Failure Mother   . Hypertension Mother   . Diabetes type II Mother   . COPD Father   . Breast cancer Sister   . Skin cancer Brother   . Lymphoma Brother     SOCIAL HISTORY:  Social History   Socioeconomic History  . Marital status: Divorced    Spouse name: Not on file  . Number of children: Not on file  . Years of education: Not on file  . Highest education level: Not on file  Occupational History  . Not on file  Social Needs  . Financial resource strain: Not on file  . Food insecurity:    Worry: Not on file    Inability: Not on file  . Transportation needs:    Medical: Not on file    Non-medical: Not on file  Tobacco Use  . Smoking status: Never Smoker  . Smokeless tobacco: Never Used  Substance and Sexual Activity  . Alcohol use: No  . Drug use: No  . Sexual activity: Not on file  Lifestyle  . Physical activity:    Days per week: Not on file    Minutes per session: Not on file  . Stress: Not on file  Relationships  . Social connections:    Talks  on phone: Not on file    Gets together: Not on file    Attends religious service: Not on file    Active  member of club or organization: Not on file    Attends meetings of clubs or organizations: Not on file    Relationship status: Not on file  . Intimate partner violence:    Fear of current or ex partner: Not on file    Emotionally abused: Not on file    Physically abused: Not on file    Forced sexual activity: Not on file  Other Topics Concern  . Not on file  Social History Narrative  . Not on file     PHYSICAL EXAM  There were no vitals filed for this visit.  There is no height or weight on file to calculate BMI.   General: The patient is well-developed and well-nourished and in no acute distress.   SOme tenderness in some FMS tender points.    Neck: The neck is mildly tender with a mildly reduced range of motion.   Skin: Extremities are without rash.  She has 1+ pedal edema.  Musculoskeletal:  Back is nontender  Neurologic Exam  Mental status: The patient is alert and oriented x 3 at the time of the examination. The patient has apparent normal recent and remote memory, with an apparently normal attention span and concentration ability.   Speech is normal.  Cranial nerves: Extraocular movements are full.  Facial strength and sensation was more normal.  Trapezius strength was slightly reduced on the left.  No obvious hearing deficits are noted.  Motor:  Muscle bulk is normal.   Tone is normal. Strength is  5 / 5 in all 4 extremities except 4+/5 ulnar intrinsic hand muscles on the right and 4+/5 APB (median) on the right.      Strength was 4/5 in the toe extensors, slightly weaker on right.   4/5 strength in the proximal left arm.  Sensory: She has altered sensation in the ulnar distribution of both hands.  There was mild reduced sensation in the toes.  Other:   She no longer has Phalen's or Tinel signs.  Coordination: Cerebellar testing reveals good finger-nose-finger and heel-to-shin bilaterally.  Gait and station: Station is normal.   Gait is normal. Tandem gait is  normal. Romberg is negative.   Reflexes: Deep tendon reflexes are symmetric and normal bilaterally.   Plantar responses are flexor.    DIAGNOSTIC DATA (LABS, IMAGING, TESTING) - I reviewed patient records, labs, notes, testing and imaging myself where available.      ASSESSMENT AND PLAN  Numbness  Polyneuropathy  Spondylosis of cervical region without myelopathy or radiculopathy  Arm weakness  1.   Etiology of symptoms is uncertain.   Will try to treat symptoms.  Increase gabapentin to 300-300-600.    2.   If left arm pain worsens consider referral to ortho. 3.   She is advised to call us if her weakness or numbness worsens or she has other neurologic symptoms. 4.    She will return for regular scheduled visit in 4 months  Richard A. Felecia Shelling, MD, [facility] 1/96/2229, 7:98 PM Certified in Neurology, Clinical Neurophysiology, Sleep Medicine, Pain Medicine and Neuroimaging  [facility] [address] [place], [place] [address] [phone]

## 2022-09-05 DIAGNOSIS — J3501 Chronic tonsillitis: Secondary | ICD-10-CM | POA: Insufficient documentation

## 2022-09-05 DIAGNOSIS — H903 Sensorineural hearing loss, bilateral: Secondary | ICD-10-CM | POA: Insufficient documentation

## 2023-04-02 DIAGNOSIS — K56609 Unspecified intestinal obstruction, unspecified as to partial versus complete obstruction: Secondary | ICD-10-CM | POA: Insufficient documentation

## 2023-05-29 ENCOUNTER — Institutional Professional Consult (permissible substitution): Payer: 59 | Admitting: Internal Medicine

## 2023-05-29 ENCOUNTER — Encounter: Payer: Self-pay | Admitting: Internal Medicine

## 2023-05-29 NOTE — Progress Notes (Deleted)
OV 05/29/2023  Subjective:  Patient ID: Susan Perry, female , DOB: 24-Mar-1956 , age 68 y.o. , MRN: [id number] , ADDRESS: [address] [place] [address] PCP Karle Plumber, MD Patient Care Team: Karle Plumber, MD as PCP - General (Internal Medicine)  This Provider for this visit: Treatment Team:  Attending Provider: Kalman Shan, MD    05/29/2023 -  No chief complaint on file.    HPI Susan Perry 67 y.o. -    CT Chest data - personally visualized and independently interpreted and my findings are: 10/10/2011   RADIOLOGY REPORT*   Clinical Data: Right pulmonary nodule from outside CT.   CT CHEST WITH CONTRAST   Technique:  Multidetector CT imaging of the chest was performed  following the standard protocol during bolus administration of  intravenous contrast.   Contrast: 75mL OMNIPAQUE IOHEXOL 300 MG/ML IV SOLN   Comparison: Abdominal CT report dated 01/28/2011 from [facility]  [place]   Findings: There is no evidence for chest lymphadenopathy.  There is  no significant pericardial or pleural fluid.  Gallbladder has been  removed.  Decreased attenuation of the liver suggests hepatic  steatosis. Normal appearance of the adrenal tissue.  No gross  abnormality in the upper abdominal structures.  Main portal veins  are patent.   The trachea and mainstem bronchi are patent.  There is a linear  density in the right lower lobe that extends to the right major  fissure. There may be a small nodule at the end of this linear  density which could represent a combination of vessel and a small  nodule.  The nodule roughly measures 4-5 mm and is adjacent to the  right major fissure.  This probably reflects the previously  described pulmonary nodule.  There are no other suspicious lesions  or nodules in the right lower lobe.  There is a linear structure  within the right lower lobe most compatible with scar or  atelectasis.  There is a  calcified nodule at the left lung base  that measures 3 mm. There are calcifications in the left hilar  region which may represent old granulomas disease.  No acute bony  abnormality.  A small area sclerosis involving the upper aspect of  the sternal body is nonspecific.   IMPRESSION:  There is a 4-5 mm nodular density in the right lower lobe which is  adjacent to the right major fissure.  This may be adjacent to a  vascular structure.  This probably reflects the previous described  nodule from the outside CT report.  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  The small nodule is  nonspecific but could represent a noncalcified granuloma. The right  lower lobe nodule is likely stable but difficult to assess without  direct comparison of the outside exam.  If the patient is low risk  for bronchogenic carcinoma, a 63-month follow-up CT would be useful  to ensure stability.   No acute findings.   Evidence for hepatic steatosis.   Original Report Authenticated By: Richarda Overlie, M.D.   No results found.    PFT      No data to display          Latest Reference Range & Units 06/23/18 10:00  ENA SSA (RO) Ab 0.0 - 0.9 AI <0.2  ENA SSB (LA) Ab 0.0 - 0.9 AI <0.2  RA Latex Turbid. 0.0 - 13.9 IU/mL <10.0  IgG (Immunoglobin  G), Serum 700 - 1,600 mg/dL 1,610  IgM (Immunoglobulin M), Srm 26 - 217 mg/dL 82  IgA/Immunoglobulin A, Serum 87 - 352 mg/dL 960 (H)  (H): Data is abnormally high    has a past medical history of Crohn's disease (HCC), Crohn's disease (HCC), Deep vein thrombosis (DVT) (HCC), GERD (gastroesophageal reflux disease), H/O splenectomy, H/O total hysterectomy, Hypertension, Protein S deficiency (HCC), Thyroid disease, and Vision abnormalities.   reports that she has never smoked. She has never used smokeless tobacco.  Past Surgical History:  Procedure Laterality Date   ABDOMINAL HYSTERECTOMY     CHOLECYSTECTOMY     CHOLECYSTECTOMY OPEN     IVC  FILTER INSERTION     SLEEVE GASTROPLASTY     TUBAL LIGATION     VASCULAR SURGERY     VEIN LIGATION AND STRIPPING Left     Allergies  Allergen Reactions   Trazodone And Nefazodone Other (See Comments)    Other reaction(s): Other - comment required hallucination Pt reports hallucinations    Lisinopril      There is no immunization history on file for this patient.  Family History  Problem Relation Age of Onset   Congestive Heart Failure Mother    Hypertension Mother    Diabetes type II Mother    COPD Father    Breast cancer Sister    Skin cancer Brother    Lymphoma Brother      Current Outpatient Medications:    buPROPion (WELLBUTRIN XL) 300 MG 24 hr tablet, Take 300 mg by mouth daily., Disp: , Rfl:    escitalopram (LEXAPRO) 20 MG tablet, Take 20 mg by mouth daily., Disp: , Rfl:    famotidine (PEPCID) 40 MG tablet, Take 40 mg by mouth daily., Disp: , Rfl:    gabapentin (NEURONTIN) 300 MG capsule, Take 1 tablet in the morning, 1 in the afternoon, and 2 at bedtime, as tolerated, for neuropathic pain, Disp: 120 capsule, Rfl: 5   HYDROcodone-acetaminophen (NORCO) 10-325 MG tablet, Take 1-2 tablets by mouth every 4 (four) hours as needed. , Disp: , Rfl:    hydrOXYzine (ATARAX/VISTARIL) 50 MG tablet, Take 50 mg by mouth daily as needed. , Disp: , Rfl:    levothyroxine (SYNTHROID, LEVOTHROID) 75 MCG tablet, Take 225 mcg by mouth daily before breakfast. , Disp: , Rfl:    LINZESS 72 MCG capsule, Take 72 mcg by mouth daily before breakfast. , Disp: , Rfl:    losartan-hydrochlorothiazide (HYZAAR) 100-25 MG tablet, 1 tablet daily. , Disp: , Rfl:    NARCAN 4 MG/0.1ML LIQD nasal spray kit, Place 1 spray into the nose. , Disp: , Rfl:    omeprazole (PRILOSEC) 40 MG capsule, Take 40 mg by mouth daily., Disp: , Rfl:    Potassium Chloride ER 20 MEQ TBCR, Take 1 tablet by mouth daily., Disp: , Rfl:    traMADol (ULTRAM) 50 MG tablet, Take 50 mg by mouth every 6 (six) hours as needed. , Disp: ,  Rfl:    valACYclovir (VALTREX) 1000 MG tablet, as needed. For outbreaks., Disp: , Rfl:    warfarin (COUMADIN) 7.5 MG tablet, Take 7.5 mg by mouth daily. 7.5 - 10 scale, Disp: , Rfl:       Objective:   There were no vitals filed for this visit.  Estimated body mass index is 38.27 kg/m as calculated from the following:   Height as of 10/22/18: 5' 3.5" (1.613 m).   Weight as of 10/22/18: 219 lb 8 oz (99.6 kg).  @  WEIGHTCHANGE@  There were no vitals filed for this visit.   Physical Exam   General: No distress. *** O2 at rest: *** Cane present: *** Sitting in wheel chair: *** Frail: *** Obese: *** Neuro: Alert and Oriented x 3. GCS 15. Speech normal Psych: Pleasant Resp:  Barrel Chest - ***.  Wheeze - ***, Crackles - ***, No overt respiratory distress CVS: Normal heart sounds. Murmurs - *** Ext: Stigmata of Connective Tissue Disease - *** HEENT: Normal upper airway. PEERL +. No post nasal drip        Assessment:     No diagnosis found.     Plan:     There are no Patient Instructions on file for this visit.    SIGNATURE    Dr. Kalman Shan, M.D., F.C.C.P,  Pulmonary and Critical Care Medicine Staff Physician, [facility] Director - Interstitial Lung Disease  Program  Pulmonary Fibrosis [facility] [place], [place], [address]  Pager: [phone], If no answer or between  15:00h - 7:00h: call [phone] Telephone: [phone]  8:19 AM 05/29/2023   Moderate Complexity MDM OFFICE  2021 E/M guidelines, first released in 2021, with minor revisions added in 2023 and 2024 Must meet the requirements for 2 out of 3 dimensions to qualify.    Number and complexity of problems addressed Amount and/or complexity of data reviewed Risk of complications and/or morbidity  One or more chronic illness with mild exacerbation, OR progression, OR  side effects of treatment  Two or more stable chronic  illnesses  One undiagnosed new problem with uncertain prognosis  One acute illness with systemic symptoms   One Acute complicated injury Must meet the requirements for 1 of 3 of the categories)  Category 1: Tests and documents, historian  Any combination of 3 of the following:  Assessment requiring an independent historian  Review of prior external note(s) from each unique source  Review of results of each unique test  Ordering of each unique test    Category 2: Interpretation of tests   Independent interpretation of a test performed by another physician/other qualified health care professional (not separately reported)  Category 3: Discuss management/tests  Discussion of management or test interpretation with external physician/other qualified health care professional/appropriate source (not separately reported) Moderate risk of morbidity from additional diagnostic testing or treatment Examples only:  Prescription drug management  Decision regarding minor surgery with identfied patient or procedure risk factors  Decision regarding elective major surgery without identified patient or procedure risk factors  Diagnosis or treatment significantly limited by social determinants of health             HIGh Complexity  OFFICE   2021 E/M guidelines, first released in 2021, with minor revisions added in 2023. Must meet the requirements for 2 out of 3 dimensions to qualify.    Number and complexity of problems addressed Amount and/or complexity of data reviewed Risk of complications and/or morbidity  Severe exacerbation of chronic illness  Acute or chronic illnesses that may pose a threat to life or bodily function, e.g., multiple trauma, acute MI, pulmonary embolus, severe respiratory distress, progressive rheumatoid arthritis, psychiatric illness with potential threat to self or others, peritonitis, acute renal failure, abrupt change in neurological status Must meet  the requirements for 2 of 3 of the categories)  Category 1: Tests and documents, historian  Any combination of 3 of the following:  Assessment requiring an  independent historian  Review of prior external note(s) from each unique source  Review of results of each unique test  Ordering of each unique test    Category 2: Interpretation of tests    Independent interpretation of a test performed by another physician/other qualified health care professional (not separately reported)  Category 3: Discuss management/tests  Discussion of management or test interpretation with external physician/other qualified health care professional/appropriate source (not separately reported)  HIGH risk of morbidity from additional diagnostic testing or treatment Examples only:  Drug therapy requiring intensive monitoring for toxicity  Decision for elective major surgery with identified pateint or procedure risk factors  Decision regarding hospitalization or escalation of level of care  Decision for DNR or to de-escalate care   Parenteral controlled  substances            LEGEND - Independent interpretation involves the interpretation of a test for which there is a CPT code, and an interpretation or report is customary. When a review and interpretation of a test is performed and documented by the provider, but not separately reported (billed), then this would represent an independent interpretation. This report does not need to conform to the usual standards of a complete report of the test. This does not include interpretation of tests that do not have formal reports such as a complete blood count with differential and blood cultures. Examples would include reviewing a chest radiograph and documenting in the medical record an interpretation, but not separately reporting (billing) the interpretation of the chest radiograph.   An appropriate source includes professionals who are not  health care professionals but may be involved in the management of the patient, such as a Clinical research associate, upper officer, case manager or teacher, and does not include discussion with family or informal caregivers.    - SDOH: SDOH are the conditions in the environments where people are born, live, learn, work, play, worship, and age that affect a wide range of health, functioning, and quality-of-life outcomes and risks. (e.g., housing, food insecurity, transportation, etc.). SDOH-related Z codes ranging from Z55-Z65 are the ICD-10-CM diagnosis codes used to document SDOH data Z55 - Problems related to education and literacy Z56 - Problems related to employment and unemployment Z57 - Occupational exposure to risk factors Z58 - Problems related to physical environment Z59 - Problems related to housing and economic circumstances [phone] - Problems related to social environment [phone] - Problems related to upbringing [phone] - Other problems related to primary support group, including family circumstances Z60 - Problems related to certain psychosocial circumstances Z65 - Problems related to other psychosocial circumstances

## 2023-09-22 DIAGNOSIS — S61411A Laceration without foreign body of right hand, initial encounter: Secondary | ICD-10-CM | POA: Insufficient documentation

## 2023-09-22 DIAGNOSIS — S64490A Injury of digital nerve of right index finger, initial encounter: Secondary | ICD-10-CM | POA: Insufficient documentation

## 2023-10-15 DIAGNOSIS — S92309A Fracture of unspecified metatarsal bone(s), unspecified foot, initial encounter for closed fracture: Secondary | ICD-10-CM | POA: Insufficient documentation

## 2023-10-15 DIAGNOSIS — M79672 Pain in left foot: Secondary | ICD-10-CM | POA: Insufficient documentation

## 2023-12-19 DIAGNOSIS — Q709 Syndactyly, unspecified: Secondary | ICD-10-CM | POA: Insufficient documentation

## 2023-12-19 DIAGNOSIS — M79644 Pain in right finger(s): Secondary | ICD-10-CM | POA: Insufficient documentation

## 2023-12-19 DIAGNOSIS — S56129S Laceration of flexor muscle, fascia and tendon of unspecified finger at forearm level, sequela: Secondary | ICD-10-CM | POA: Insufficient documentation

## 2024-04-01 LAB — HM MAMMOGRAPHY

## 2024-04-29 ENCOUNTER — Other Ambulatory Visit (HOSPITAL_COMMUNITY): Payer: Self-pay | Admitting: Family Medicine

## 2024-04-29 DIAGNOSIS — R072 Precordial pain: Secondary | ICD-10-CM

## 2024-05-19 ENCOUNTER — Telehealth (HOSPITAL_COMMUNITY): Payer: Self-pay | Admitting: Emergency Medicine

## 2024-05-19 MED ORDER — METOPROLOL TARTRATE 100 MG PO TABS: 100.0000 mg | ORAL_TABLET | Freq: Once | ORAL | 0 refills | Status: DC

## 2024-05-19 NOTE — Telephone Encounter (Signed)
 Reaching out to patient to offer assistance regarding upcoming cardiac imaging study; pt verbalizes understanding of appt date/time, parking situation and where to check in, pre-test NPO status and medications ordered, and verified current allergies; name and call back number provided for further questions should they arise Rockwell Alexandria RN Navigator Cardiac Imaging [facility] [phone] office [phone] cell

## 2024-05-20 ENCOUNTER — Ambulatory Visit (HOSPITAL_BASED_OUTPATIENT_CLINIC_OR_DEPARTMENT_OTHER)
Admission: RE | Admit: 2024-05-20 | Discharge: 2024-05-20 | Disposition: A | Discharge status: Home / Self Care | Source: Ambulatory Visit | Attending: Family Medicine | Admitting: Family Medicine

## 2024-05-20 DIAGNOSIS — R072 Precordial pain: Secondary | ICD-10-CM

## 2024-05-20 DIAGNOSIS — I2510 Atherosclerotic heart disease of native coronary artery without angina pectoris: Secondary | ICD-10-CM | POA: Diagnosis not present

## 2024-05-20 MED ORDER — METOPROLOL TARTRATE 5 MG/5ML IV SOLN: 10.0000 mg | Freq: Once | INTRAVENOUS | Status: DC | PRN

## 2024-05-20 MED ORDER — NITROGLYCERIN 0.4 MG SL SUBL
0.8000 mg | SUBLINGUAL_TABLET | Freq: Once | SUBLINGUAL | Status: AC
Start: 2024-05-20 — End: 2024-05-20
  Administered 2024-05-20: 0.8 mg via SUBLINGUAL

## 2024-05-20 MED ORDER — IOHEXOL 350 MG/ML SOLN
100.0000 mL | Freq: Once | INTRAVENOUS | Status: AC | PRN
  Administered 2024-05-20: 100 mL via INTRAVENOUS

## 2024-05-20 MED ORDER — NITROGLYCERIN 0.4 MG SL SUBL
0.8000 mg | SUBLINGUAL_TABLET | Freq: Once | SUBLINGUAL | Status: AC
  Administered 2024-05-20: 0.8 mg via SUBLINGUAL

## 2024-05-20 MED ORDER — DILTIAZEM HCL 25 MG/5ML IV SOLN
10.0000 mg | INTRAVENOUS | Status: DC | PRN
Start: 2024-05-20 — End: 2024-05-21

## 2024-05-23 ENCOUNTER — Ambulatory Visit: Payer: Self-pay | Admitting: Cardiology

## 2024-05-23 ENCOUNTER — Ambulatory Visit (HOSPITAL_BASED_OUTPATIENT_CLINIC_OR_DEPARTMENT_OTHER)
Admission: RE | Admit: 2024-05-23 | Discharge: 2024-05-23 | Disposition: A | Discharge status: Home / Self Care | Source: Ambulatory Visit | Attending: Cardiology | Admitting: Cardiology

## 2024-05-23 ENCOUNTER — Other Ambulatory Visit: Payer: Self-pay | Admitting: Cardiology

## 2024-05-23 DIAGNOSIS — R931 Abnormal findings on diagnostic imaging of heart and coronary circulation: Secondary | ICD-10-CM

## 2024-05-23 NOTE — Progress Notes (Signed)
 FFR Orderds

## 2024-06-25 LAB — HM DEXA SCAN

## 2024-07-27 DIAGNOSIS — S3011XA Contusion of abdominal wall, initial encounter: Secondary | ICD-10-CM | POA: Insufficient documentation

## 2024-08-28 DIAGNOSIS — I517 Cardiomegaly: Secondary | ICD-10-CM | POA: Diagnosis not present

## 2024-09-16 NOTE — Progress Notes (Signed)
 [facility] at [facility] [address] [place],  [place]  [address] [phone]  Clinic Day:  09/19/2024  Referring physician: Dorena Fernando HERO, MD   HISTORY OF PRESENT ILLNESS:  The patient is a 68 y.o. female who I was asked to consult upon with respect to having protein S deficiency.   This consult was also placed to discuss the best way to address her anticoagulation.  According to the patient, she was diagnosed with protein S deficiency approximately 30 years ago.  This diagnosis was made after she had multiple superficial clots, as well as an unexpected DVT in her left leg.  This led to her being placed on Coumadin, which she was on for decades.  She was also given an IVC filter at that time.  The patient claims she had very erratic INRs while on Coumadin.  Approximately 1 month ago, this patient developed bilateral pulmonary emboli despite being on Coumadin and having an INR of 5.1.  This led to her being switched to Eliquis, which she continues to take at 5 mg twice daily.  As no INR levels have to be checked while on Eliquis, she prefers to remain on this agent.  The patient claims to have had a pulmonary hemorrhage 1 year ago while on Coumadin.  At that time, she had an INR of 3.  Her Coumadin was briefly stopped before being restarted.  She claims that when her bilateral pulmonary emboli were recently found, she also had scant hemoptysis at the time.  Although she has 12 siblings, many of whom have had either DVTs or pulmonary emboli, she claims that none of them has been diagnosed with protein S deficiency.  PAST MEDICAL HISTORY:   Past Medical History:  Diagnosis Date   Crohn's disease (HCC)    Deep vein thrombosis (DVT) (HCC)    GERD (gastroesophageal reflux disease)    H/O splenectomy    H/O total hysterectomy    Hypertension    Protein S deficiency    Thyroid disease    Vision abnormalities     PAST SURGICAL HISTORY:   Past Surgical History:   Procedure Laterality Date   ABDOMINAL HYSTERECTOMY     CHOLECYSTECTOMY OPEN     IVC FILTER INSERTION     PLEURECTOMY WITH DECORTICATION     SLEEVE GASTROPLASTY     TUBAL LIGATION     VEIN LIGATION AND STRIPPING Left    CURRENT MEDICATIONS:   Current Outpatient Medications  Medication Sig Dispense Refill   albuterol (VENTOLIN HFA) 108 (90 Base) MCG/ACT inhaler Inhale into the lungs every 6 (six) hours as needed for wheezing or shortness of breath.     apixaban (ELIQUIS) 5 MG TABS tablet Take 5 mg by mouth 2 (two) times daily.     baclofen (LIORESAL) 20 MG tablet Take 20 mg by mouth 3 (three) times daily.     dexlansoprazole (DEXILANT) 60 MG capsule Take 60 mg by mouth daily.     donepezil (ARICEPT) 5 MG tablet Take 5 mg by mouth at bedtime.     montelukast (SINGULAIR) 10 MG tablet Take 10 mg by mouth at bedtime.     nitroGLYCERIN  (NITROSTAT ) 0.4 MG SL tablet Place 0.4 mg under the tongue every 5 (five) minutes as needed for chest pain.     rosuvastatin (CRESTOR) 5 MG tablet Take 5 mg by mouth daily.     buPROPion (WELLBUTRIN XL) 300 MG 24 hr tablet Take 300 mg by mouth daily.  escitalopram (LEXAPRO) 20 MG tablet Take 20 mg by mouth daily.     famotidine (PEPCID) 40 MG tablet Take 40 mg by mouth daily.     HYDROcodone-acetaminophen (NORCO) 10-325 MG tablet Take 1-2 tablets by mouth every 4 (four) hours as needed.      hydrOXYzine (ATARAX/VISTARIL) 50 MG tablet Take 50 mg by mouth daily as needed.      levothyroxine (SYNTHROID, LEVOTHROID) 75 MCG tablet Take 225 mcg by mouth daily before breakfast.      LINZESS 72 MCG capsule Take 72 mcg by mouth daily before breakfast.      losartan-hydrochlorothiazide (HYZAAR) 100-25 MG tablet 1 tablet daily.      NARCAN 4 MG/0.1ML LIQD nasal spray kit Place 1 spray into the nose.      valACYclovir (VALTREX) 1000 MG tablet as needed. For outbreaks.     No current facility-administered medications for this visit.    ALLERGIES:   Allergies   Allergen Reactions   Trazodone And Nefazodone Other (See Comments)    Other reaction(s): Other - comment required hallucination Pt reports hallucinations    Clindamycin/Lincomycin    Lisinopril    Wound Dressing Adhesive Dermatitis    blisters    FAMILY HISTORY:   Family History  Problem Relation Age of Onset   Congestive Heart Failure Mother    Hypertension Mother    Diabetes type II Mother    Cerebral aneurysm Mother    COPD Father    Prostate cancer Father    Breast cancer Sister    Deep vein thrombosis Sister    Ulcers Sister    Epilepsy Sister    Schizophrenia Sister    Hypertension Sister    Macular degeneration Sister    Atrial fibrillation Sister    Heart disease Sister    Crohn's disease Sister    Deep vein thrombosis Sister    Deep vein thrombosis Sister    Hypertension Sister    Crohn's disease Sister    Breast cancer Sister    Skin cancer Brother    Diabetes Brother    Hypertension Brother    Stroke Brother    Skin cancer Brother    Lymphoma Brother    Other Brother        PROGRESSIVE SUPRANUCLEAR PALSY - PRIMARY PROGRESSIVE APHASIA   Dementia Brother    Lymphoma Maternal Grandmother     SOCIAL HISTORY:  The patient was born and raised in [place] .  She currently lives in town.  She is divorced, with 3 children and 6 grandchildren.  She had multiple jobs during her lifetime, including being a Teaching laboratory technician, Furniture conservator/restorer, and Psychiatric nurse.  She did smoke as much as 2 packs of cigarettes daily for 15 years before quitting 37 years ago.  There is no history of alcohol abuse.  REVIEW OF SYSTEMS:  Review of Systems  Constitutional:  Positive for fatigue. Negative for fever.  HENT:   Negative for hearing loss and sore throat.   Eyes:  Positive for eye problems.  Respiratory:  Positive for cough, hemoptysis and shortness of breath. Negative for chest tightness.   Cardiovascular:  Positive for chest pain and palpitations.  Gastrointestinal:   Positive for constipation and diarrhea. Negative for abdominal distention, abdominal pain, blood in stool, nausea and vomiting.  Endocrine: Negative for hot flashes.  Genitourinary:  Positive for hematuria. Negative for difficulty urinating, dysuria, frequency and nocturia.   Musculoskeletal:  Positive for arthralgias and gait problem. Negative for back pain  and myalgias.  Skin: Negative.  Negative for itching and rash.  Neurological:  Positive for gait problem and headaches. Negative for dizziness, extremity weakness, light-headedness and numbness.  Hematological: Negative.   Psychiatric/Behavioral:  Positive for depression. Negative for suicidal ideas. The patient is nervous/anxious.    PHYSICAL EXAM:  Blood pressure (!) 130/57, pulse (!) 131, temperature 98.1 F (36.7 C), temperature source Oral, resp. rate 16, height 5' 3 (1.6 m), weight 250 lb 4.8 oz (113.5 kg), SpO2 93%. Wt Readings from Last 3 Encounters:  09/17/24 250 lb 4.8 oz (113.5 kg)  10/22/18 219 lb 8 oz (99.6 kg)  06/23/18 216 lb (98 kg)   Body mass index is 44.34 kg/m. Performance status (ECOG): 1 - Symptomatic but completely ambulatory Physical Exam Constitutional:      Appearance: Normal appearance. She is obese. She is not ill-appearing.  HENT:     Mouth/Throat:     Mouth: Mucous membranes are moist.     Pharynx: Oropharynx is clear. No oropharyngeal exudate or posterior oropharyngeal erythema.  Cardiovascular:     Rate and Rhythm: Regular rhythm. Tachycardia present.     Heart sounds: No murmur heard.    No friction rub. No gallop.  Pulmonary:     Effort: Pulmonary effort is normal. No respiratory distress.     Breath sounds: Normal breath sounds. No wheezing, rhonchi or rales.  Abdominal:     General: Bowel sounds are normal. There is no distension.     Palpations: Abdomen is soft. There is no mass.     Tenderness: There is no abdominal tenderness.  Musculoskeletal:        General: No swelling.     Right  lower leg: No edema.     Left lower leg: No edema.  Lymphadenopathy:     Cervical: No cervical adenopathy.     Upper Body:     Right upper body: No supraclavicular or axillary adenopathy.     Left upper body: No supraclavicular or axillary adenopathy.     Lower Body: No right inguinal adenopathy. No left inguinal adenopathy.  Skin:    General: Skin is warm.     Coloration: Skin is not jaundiced.     Findings: No lesion or rash.  Neurological:     General: No focal deficit present.     Mental Status: She is alert and oriented to person, place, and time. Mental status is at baseline.  Psychiatric:        Mood and Affect: Mood normal.        Behavior: Behavior normal.        Thought Content: Thought content normal.    LABS:      Latest Ref Rng & Units 09/17/2024   11:36 AM  CBC  WBC 4.0 - 10.5 K/uL 6.5   Hemoglobin 12.0 - 15.0 g/dL 88.8   Hematocrit 63.9 - 46.0 % 35.3   Platelets 150 - 400 K/uL 446     Latest Reference Range & Units 09/17/24 11:36  MCV 80.0 - 100.0 fL 89.8   ASSESSMENT & PLAN:  A 68 y.o. female who I was asked to consult upon for likely having protein S deficiency.  As his diagnosis was made over 30 years ago at an outside facility, I will recheck her protein S levels today to verify if she is indeed protein S deficient.  I will also check her for protein C deficiency.  Despite being on Eliquis, this agent should not affect either of these  protein levels. I do believe her episode of recent hemoptysis was due to the fact that she may have had an infarct from her bilateral pulmonary emboli.  I do not believe her anticoagulation was the reason behind her hemoptysis.  Furthermore, the patient has not had any further problems since Eliquis was prescribed.  I will see this patient back in 2 weeks to go over her protein C and protein S lab results.  For now, she knows to continue taking her Eliquis indefinitely.  The patient understands all the plans discussed today and is  in agreement with them.  I do appreciate Dorena Fernando HERO, MD for his new consult.   Tanise Russman DELENA Kerns, MD

## 2024-09-17 ENCOUNTER — Other Ambulatory Visit: Payer: Self-pay | Admitting: Hematology and Oncology

## 2024-09-17 ENCOUNTER — Other Ambulatory Visit: Payer: Self-pay | Admitting: Oncology

## 2024-09-17 ENCOUNTER — Encounter: Payer: Self-pay | Admitting: Oncology

## 2024-09-17 ENCOUNTER — Inpatient Hospital Stay

## 2024-09-17 ENCOUNTER — Telehealth: Payer: Self-pay | Admitting: Oncology

## 2024-09-17 ENCOUNTER — Other Ambulatory Visit: Payer: Self-pay

## 2024-09-17 ENCOUNTER — Inpatient Hospital Stay: Attending: Oncology | Admitting: Oncology

## 2024-09-17 VITALS — BP 130/57 | HR 131 | Temp 98.1°F | Resp 16 | Ht 63.0 in | Wt 250.3 lb

## 2024-09-17 DIAGNOSIS — Z818 Family history of other mental and behavioral disorders: Secondary | ICD-10-CM | POA: Insufficient documentation

## 2024-09-17 DIAGNOSIS — I82431 Acute embolism and thrombosis of right popliteal vein: Secondary | ICD-10-CM | POA: Insufficient documentation

## 2024-09-17 DIAGNOSIS — Z87891 Personal history of nicotine dependence: Secondary | ICD-10-CM | POA: Insufficient documentation

## 2024-09-17 DIAGNOSIS — R079 Chest pain, unspecified: Secondary | ICD-10-CM | POA: Diagnosis not present

## 2024-09-17 DIAGNOSIS — R059 Cough, unspecified: Secondary | ICD-10-CM | POA: Diagnosis not present

## 2024-09-17 DIAGNOSIS — R002 Palpitations: Secondary | ICD-10-CM | POA: Insufficient documentation

## 2024-09-17 DIAGNOSIS — Z8042 Family history of malignant neoplasm of prostate: Secondary | ICD-10-CM | POA: Insufficient documentation

## 2024-09-17 DIAGNOSIS — M2550 Pain in unspecified joint: Secondary | ICD-10-CM | POA: Diagnosis not present

## 2024-09-17 DIAGNOSIS — D6859 Other primary thrombophilia: Secondary | ICD-10-CM

## 2024-09-17 DIAGNOSIS — R319 Hematuria, unspecified: Secondary | ICD-10-CM | POA: Diagnosis not present

## 2024-09-17 DIAGNOSIS — I2699 Other pulmonary embolism without acute cor pulmonale: Secondary | ICD-10-CM | POA: Diagnosis not present

## 2024-09-17 DIAGNOSIS — R197 Diarrhea, unspecified: Secondary | ICD-10-CM | POA: Insufficient documentation

## 2024-09-17 DIAGNOSIS — Z86718 Personal history of other venous thrombosis and embolism: Secondary | ICD-10-CM | POA: Diagnosis not present

## 2024-09-17 DIAGNOSIS — K5900 Constipation, unspecified: Secondary | ICD-10-CM | POA: Diagnosis not present

## 2024-09-17 DIAGNOSIS — Z79890 Hormone replacement therapy (postmenopausal): Secondary | ICD-10-CM | POA: Diagnosis not present

## 2024-09-17 DIAGNOSIS — Z79899 Other long term (current) drug therapy: Secondary | ICD-10-CM | POA: Diagnosis not present

## 2024-09-17 DIAGNOSIS — E669 Obesity, unspecified: Secondary | ICD-10-CM | POA: Insufficient documentation

## 2024-09-17 DIAGNOSIS — R042 Hemoptysis: Secondary | ICD-10-CM | POA: Diagnosis not present

## 2024-09-17 DIAGNOSIS — Z86711 Personal history of pulmonary embolism: Secondary | ICD-10-CM | POA: Insufficient documentation

## 2024-09-17 DIAGNOSIS — Z881 Allergy status to other antibiotic agents status: Secondary | ICD-10-CM | POA: Insufficient documentation

## 2024-09-17 DIAGNOSIS — Z8249 Family history of ischemic heart disease and other diseases of the circulatory system: Secondary | ICD-10-CM | POA: Insufficient documentation

## 2024-09-17 DIAGNOSIS — Z7901 Long term (current) use of anticoagulants: Secondary | ICD-10-CM | POA: Insufficient documentation

## 2024-09-17 DIAGNOSIS — Z9081 Acquired absence of spleen: Secondary | ICD-10-CM | POA: Insufficient documentation

## 2024-09-17 DIAGNOSIS — R5383 Other fatigue: Secondary | ICD-10-CM | POA: Diagnosis not present

## 2024-09-17 DIAGNOSIS — Z8379 Family history of other diseases of the digestive system: Secondary | ICD-10-CM | POA: Insufficient documentation

## 2024-09-17 DIAGNOSIS — R269 Unspecified abnormalities of gait and mobility: Secondary | ICD-10-CM | POA: Diagnosis not present

## 2024-09-17 DIAGNOSIS — Z825 Family history of asthma and other chronic lower respiratory diseases: Secondary | ICD-10-CM | POA: Insufficient documentation

## 2024-09-17 DIAGNOSIS — Z807 Family history of other malignant neoplasms of lymphoid, hematopoietic and related tissues: Secondary | ICD-10-CM | POA: Insufficient documentation

## 2024-09-17 DIAGNOSIS — Z820 Family history of epilepsy and other diseases of the nervous system: Secondary | ICD-10-CM | POA: Insufficient documentation

## 2024-09-17 DIAGNOSIS — F32A Depression, unspecified: Secondary | ICD-10-CM | POA: Diagnosis not present

## 2024-09-17 DIAGNOSIS — Z90710 Acquired absence of both cervix and uterus: Secondary | ICD-10-CM | POA: Insufficient documentation

## 2024-09-17 DIAGNOSIS — R0602 Shortness of breath: Secondary | ICD-10-CM | POA: Insufficient documentation

## 2024-09-17 DIAGNOSIS — Z803 Family history of malignant neoplasm of breast: Secondary | ICD-10-CM | POA: Insufficient documentation

## 2024-09-17 DIAGNOSIS — Z833 Family history of diabetes mellitus: Secondary | ICD-10-CM | POA: Insufficient documentation

## 2024-09-17 DIAGNOSIS — K5090 Crohn's disease, unspecified, without complications: Secondary | ICD-10-CM | POA: Diagnosis not present

## 2024-09-17 DIAGNOSIS — Z808 Family history of malignant neoplasm of other organs or systems: Secondary | ICD-10-CM | POA: Insufficient documentation

## 2024-09-17 DIAGNOSIS — R519 Headache, unspecified: Secondary | ICD-10-CM | POA: Diagnosis not present

## 2024-09-17 DIAGNOSIS — Z888 Allergy status to other drugs, medicaments and biological substances status: Secondary | ICD-10-CM | POA: Insufficient documentation

## 2024-09-17 DIAGNOSIS — Z823 Family history of stroke: Secondary | ICD-10-CM | POA: Insufficient documentation

## 2024-09-17 LAB — CBC WITH DIFFERENTIAL (CANCER CENTER ONLY)
Abs Immature Granulocytes: 0.01 K/uL (ref 0.00–0.07)
Basophils Absolute: 0.1 K/uL (ref 0.0–0.1)
Basophils Relative: 1 %
Eosinophils Absolute: 0.5 K/uL (ref 0.0–0.5)
Eosinophils Relative: 8 %
HCT: 35.3 % — ABNORMAL LOW (ref 36.0–46.0)
Hemoglobin: 11.1 g/dL — ABNORMAL LOW (ref 12.0–15.0)
Immature Granulocytes: 0 %
Lymphocytes Relative: 37 %
Lymphs Abs: 2.4 K/uL (ref 0.7–4.0)
MCH: 28.2 pg (ref 26.0–34.0)
MCHC: 31.4 g/dL (ref 30.0–36.0)
MCV: 89.8 fL (ref 80.0–100.0)
Monocytes Absolute: 0.7 K/uL (ref 0.1–1.0)
Monocytes Relative: 11 %
Neutro Abs: 2.8 K/uL (ref 1.7–7.7)
Neutrophils Relative %: 43 %
Platelet Count: 446 K/uL — ABNORMAL HIGH (ref 150–400)
RBC: 3.93 MIL/uL (ref 3.87–5.11)
RDW: 17.2 % — ABNORMAL HIGH (ref 11.5–15.5)
WBC Count: 6.5 K/uL (ref 4.0–10.5)
nRBC: 0 % (ref 0.0–0.2)

## 2024-09-17 NOTE — Telephone Encounter (Signed)
 Patient has been scheduled for follow-up visit per 09/17/24 LOS.  Pt given an appt calendar with date and time.

## 2024-09-18 LAB — PROTEIN S PANEL
Protein S Activity: 84 % (ref 63–140)
Protein S Ag, Free: 101 % (ref 61–136)
Protein S Ag, Total: 99 % (ref 60–150)

## 2024-09-18 LAB — PROTEIN S, TOTAL AND FREE
Protein S Ag, Free: 115 % (ref 61–136)
Protein S Ag, Total: 98 % (ref 60–150)

## 2024-09-19 ENCOUNTER — Other Ambulatory Visit: Payer: Self-pay | Admitting: Oncology

## 2024-09-19 DIAGNOSIS — D6859 Other primary thrombophilia: Secondary | ICD-10-CM

## 2024-09-22 LAB — PROTEIN C DEFICIENCY PROFILE
Protein C Activity: 109 % (ref 73–180)
Protein C, Total: 82 % (ref 60–150)

## 2024-09-30 NOTE — Progress Notes (Unsigned)
 [facility] at [facility] [address] [place],  [place]  [address] [phone]  Clinic Day:  10/01/2024  Referring physician: Dorena Fernando HERO, MD   HISTORY OF PRESENT ILLNESS:  The patient is a 68 y.o. female who I recently began seeing as she was previously diagnosed with protein S deficiency.  Per labs in 2004, she was diagnosed with protein S deficiency at the time she had a DVT in her right popliteal vein. This led to her being placed on Coumadin, which she was on for decades. However, she  developed bilateral pulmonary emboli despite being on Coumadin and having an INR of 5.1.  This led to her being switched to Eliquis, which she continues to take at 5 mg twice daily.  Of note, although she has 12 siblings, many of whom have had either DVTs or pulmonary emboli, none of them has been diagnosed with protein S deficiency.  PHYSICAL EXAM:  DEFERRED   LABS:      Latest Ref Rng & Units 09/17/2024   11:36 AM  CBC  WBC 4.0 - 10.5 K/uL 6.5   Hemoglobin 12.0 - 15.0 g/dL 88.8   Hematocrit 63.9 - 46.0 % 35.3   Platelets 150 - 400 K/uL 446     Latest Reference Range & Units 09/17/24 11:36  Protein C-Functional 73 - 180 % 109  Protein C, Total 60 - 150 % 82  Protein S-Functional 63 - 140 % 84  Protein S, Free 61 - 136 % 101  Protein S, Total 60 - 150 % 99  --------------------------------------------------------------------------------------------------------------------- Protein S studies from January 2024:  Component 21 yr ago Comments  PROTEIN S FUNCTIONAL 30 L Unit: % of normal (NOTE)    Component Ref Range & Units 21 yr ago Comments  PROTEIN S, FREE % 50 Reference range: 50 to 120  PROTEIN S, TOTAL CANC Reference range: 50 to 120 (NOTE)   SCANS:  A Doppler ultrasound from 11-30-2002: The deep veins are satisfactorily demonstrated in the thigh with  satisfactory flow and Doppler  signal.  There is no evidence of deep vein thrombosis in the   superficial femoral vein or greater  saphenous vein.  A thrombus formation is noted in the right popliteal vein but there  is flow around the thrombus in  the popliteal vein.  The deep veins are compressible in the  superficial femoral vein or the greater  saphenous vein and the popliteal vein is also compressible, however.   The calf veins are  compressible and flow is noted.  IMPRESSION:  A short segment of thrombus formation is noted in the right popliteal  vein but the flow is noted  around the thrombus and the popliteal vein is somewhat compressible.   A similar finding was also  reported in the previous study of 10/17/02.  Other deep veins in the thigh and calf are unremarkable with flow  identified.    ASSESSMENT & PLAN:  A 68 y.o. female who I recently began seeing for apparently having protein S deficiency.  However, her recent protein S studies came back completely normal.    There is the possibility that the acute DVT she had at the time led to her lower-than-normal protein S levels.  Furthermore, there is uncertainty as to whether the patient was on warfarin at the time her protein S levels were done back in 2004.  In the latter half of 2003, the patient had multiple Doppler ultrasounds which showed a DVT in her  right popliteal vein.  Protein S levels usually are not affected by Eliquis.  To erase any shroud of uncertainty, her protein S levels will be rechecked in another 3 months.  If they come back normal at that time, then I likely will not consider her having protein S deficiency, particularly when no one else in her family has ever been diagnosed with this disorder.  Irrespective of what her protein S diagnosis status ends up being, the fact that she has had 2 different blood clots over her lifetime essentially relegates her to lifelong anticoagulation.  I will see this patient back in 3 months to go over her protein S studies and their implications. The patient understands  all the plans discussed today and is in agreement with them.  Clarion Mooneyhan DELENA Kerns, MD

## 2024-10-01 ENCOUNTER — Inpatient Hospital Stay (HOSPITAL_BASED_OUTPATIENT_CLINIC_OR_DEPARTMENT_OTHER): Admitting: Oncology

## 2024-10-01 ENCOUNTER — Other Ambulatory Visit: Payer: Self-pay | Admitting: Oncology

## 2024-10-01 ENCOUNTER — Ambulatory Visit: Admitting: Internal Medicine

## 2024-10-01 DIAGNOSIS — D6859 Other primary thrombophilia: Secondary | ICD-10-CM

## 2024-10-01 MED ORDER — APIXABAN 5 MG PO TABS: 5.0000 mg | ORAL_TABLET | Freq: Two times a day (BID) | ORAL | 3 refills | Status: DC

## 2024-10-04 ENCOUNTER — Telehealth: Payer: Self-pay | Admitting: Oncology

## 2024-10-04 NOTE — Telephone Encounter (Signed)
 Patient has been scheduled for follow-up visit per 10/01/24 LOS.  Pt aware of scheduled appt details.

## 2024-10-08 ENCOUNTER — Ambulatory Visit: Admitting: Internal Medicine

## 2024-10-08 ENCOUNTER — Encounter: Payer: Self-pay | Admitting: Internal Medicine

## 2024-10-08 VITALS — BP 140/90 | HR 69 | Temp 98.1°F | Resp 18 | Ht 63.0 in | Wt 247.5 lb

## 2024-10-08 DIAGNOSIS — E782 Mixed hyperlipidemia: Secondary | ICD-10-CM | POA: Diagnosis not present

## 2024-10-08 DIAGNOSIS — Z7901 Long term (current) use of anticoagulants: Secondary | ICD-10-CM

## 2024-10-08 DIAGNOSIS — E039 Hypothyroidism, unspecified: Secondary | ICD-10-CM

## 2024-10-08 DIAGNOSIS — I10 Essential (primary) hypertension: Secondary | ICD-10-CM | POA: Diagnosis not present

## 2024-10-08 NOTE — Assessment & Plan Note (Signed)
 She will continue with levothyroxine and I will do TSH level next visit.

## 2024-10-08 NOTE — Assessment & Plan Note (Signed)
 Blood pressure is upper end of normal so will continue to monitor.

## 2024-10-08 NOTE — Assessment & Plan Note (Signed)
 She will continue with rosuvastatin 5 mg daily.  I will do lipid panel on next visit.

## 2024-10-08 NOTE — Assessment & Plan Note (Signed)
 She is currently on Eliquis 5 mg twice a day for anticoagulation.

## 2024-10-08 NOTE — Progress Notes (Signed)
 New Patient Office Visit  Subjective    Patient ID: Susan Perry, female    DOB: [birth year]  Age: 68 y.o. MRN: [id number]  CC:  Chief Complaint  Patient presents with   New Patient (Initial Visit)    HPI Susan Perry presents to establish care with us .   She says that she was going to [facility] for 10 years but she lives here. She was admitted to [facility] on September 26 and discharged on September 30th when she was diagnosed with pulmonary embolism without acute cor pulmonale.  She was on Coumadin before because of protein S deficiency per patient.  She was started on Eliquis 5 mg twice a day.  She will see if Dr. Ezzard for further workup.  She also has a IVC filter placed.    She also has a history of Crohn disease and she will follow-up with Dr. Larene.    She has a chronic back pain with back surgery in the past.  She says that she has a back and knee pain and she will continue to follow with Dr. Dorena  for pain management.    She also has a history of hypertension and hyperlipidemia.  She takes losartan hydrochlorothiazide 100-25 mg 1 tablets daily. Her blood pressure is upper end of normal. I have reviewed her medication.    She also has hyperlipidemia and she take rosuvastatin  5 mg daily.  She says that she ran out of all her medication.   She also has history of anxiety and depression for that she takes Wellbutrin 300 mg daily.  She also take escitalopram 20 mg daily. I have discussed with her that I would need to get records from her previous doctor.  She tells me that she has been drinking water and she is urinating a lot and she wonder if he has diabetes.  Her random blood sugar was 101 in our office today.  Outpatient Encounter Medications as of 10/08/2024  Medication Sig   albuterol (VENTOLIN HFA) 108 (90 Base) MCG/ACT inhaler Inhale into the lungs every 6 (six) hours as needed for wheezing or shortness of breath.   apixaban (ELIQUIS)  5 MG TABS tablet Take 1 tablet (5 mg total) by mouth 2 (two) times daily.   baclofen (LIORESAL) 20 MG tablet Take 20 mg by mouth 3 (three) times daily.   buPROPion (WELLBUTRIN XL) 300 MG 24 hr tablet Take 300 mg by mouth daily.   dexlansoprazole (DEXILANT) 60 MG capsule Take 60 mg by mouth daily.   donepezil (ARICEPT) 5 MG tablet Take 5 mg by mouth at bedtime.   escitalopram (LEXAPRO) 20 MG tablet Take 20 mg by mouth daily.   famotidine (PEPCID) 40 MG tablet Take 40 mg by mouth daily.   HYDROcodone-acetaminophen (NORCO) 10-325 MG tablet Take 1-2 tablets by mouth every 4 (four) hours as needed.    hydrOXYzine (ATARAX/VISTARIL) 50 MG tablet Take 50 mg by mouth daily as needed.    levothyroxine (SYNTHROID, LEVOTHROID) 75 MCG tablet Take 225 mcg by mouth daily before breakfast.    LINZESS 72 MCG capsule Take 72 mcg by mouth daily before breakfast.    losartan-hydrochlorothiazide (HYZAAR) 100-25 MG tablet 1 tablet daily.    montelukast (SINGULAIR) 10 MG tablet Take 10 mg by mouth at bedtime.   NARCAN 4 MG/0.1ML LIQD nasal spray kit Place 1 spray into the nose.    nitroGLYCERIN  (NITROSTAT ) 0.4 MG SL tablet Place 0.4 mg under the tongue every 5 (five)  minutes as needed for chest pain.   rosuvastatin (CRESTOR) 5 MG tablet Take 5 mg by mouth daily.   valACYclovir (VALTREX) 1000 MG tablet as needed. For outbreaks.   No facility-administered encounter medications on file as of 10/08/2024.    Past Medical History:  Diagnosis Date   Crohn's disease (HCC)    Deep vein thrombosis (DVT) (HCC)    GERD (gastroesophageal reflux disease)    H/O splenectomy    H/O total hysterectomy    Hypertension    Protein S deficiency    Thyroid disease    Vision abnormalities     Past Surgical History:  Procedure Laterality Date   ABDOMINAL HYSTERECTOMY     CHOLECYSTECTOMY OPEN     IVC FILTER INSERTION     PLEURECTOMY WITH DECORTICATION     SLEEVE GASTROPLASTY     TUBAL LIGATION     VEIN LIGATION AND  STRIPPING Left     Family History  Problem Relation Age of Onset   Congestive Heart Failure Mother    Hypertension Mother    Diabetes type II Mother    Cerebral aneurysm Mother    COPD Father    Prostate cancer Father    Breast cancer Sister    Deep vein thrombosis Sister    Ulcers Sister    Epilepsy Sister    Schizophrenia Sister    Hypertension Sister    Macular degeneration Sister    Atrial fibrillation Sister    Heart disease Sister    Crohn's disease Sister    Deep vein thrombosis Sister    Deep vein thrombosis Sister    Hypertension Sister    Crohn's disease Sister    Breast cancer Sister    Skin cancer Brother    Diabetes Brother    Hypertension Brother    Stroke Brother    Skin cancer Brother    Lymphoma Brother    Other Brother        PROGRESSIVE SUPRANUCLEAR PALSY - PRIMARY PROGRESSIVE APHASIA   Dementia Brother    Lymphoma Maternal Grandmother     Social History   Socioeconomic History   Marital status: Divorced    Spouse name: Not on file   Number of children: 3   Years of education: 12   Highest education level: Not on file  Occupational History   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  Tobacco Use   Smoking status: Never   Smokeless tobacco: Never  Vaping Use   Vaping status: Never Used  Substance and Sexual Activity   Alcohol use: No   Drug use: No   Sexual activity: Not Currently  Other Topics Concern   Not on file  Social History Narrative   Not on file   Social Drivers of Health   Financial Resource Strain: Not on file  Food Insecurity: Food Insecurity Present (09/17/2024)   Hunger Vital Sign    Worried About Running Out of Food in the Last Year: Never true    Ran Out of Food in the Last Year: Sometimes true  Transportation Needs: No Transportation Needs (09/17/2024)   PRAPARE - Administrator, Civil Service (Medical): No    Lack of Transportation (Non-Medical): No  Physical Activity: Not on file  Stress: Not on file   Social Connections: Not on file  Intimate Partner Violence: Not At Risk (09/17/2024)   Humiliation, Afraid, Rape, and Kick questionnaire    Fear of Current or Ex-Partner: No    Emotionally Abused: No  Physically Abused: No    Sexually Abused: No    Review of Systems  Constitutional: Negative.   HENT: Negative.    Respiratory: Negative.    Cardiovascular: Negative.   Genitourinary:  Positive for frequency.  Musculoskeletal:  Positive for back pain.  Neurological: Negative.         Objective    BP (!) 140/90 (BP Location: Left Arm, Cuff Size: Normal)   Pulse 69   Temp 98.1 F (36.7 C)   Resp 18   Ht 5' 3 (1.6 m)   Wt 247 lb 8 oz (112.3 kg)   SpO2 96%   BMI 43.84 kg/m   Physical Exam Constitutional:      Appearance: Normal appearance.  HENT:     Head: Normocephalic and atraumatic.  Eyes:     Extraocular Movements: Extraocular movements intact.     Pupils: Pupils are equal, round, and reactive to light.  Cardiovascular:     Rate and Rhythm: Normal rate and regular rhythm.     Heart sounds: Normal heart sounds.  Pulmonary:     Effort: Pulmonary effort is normal.     Breath sounds: Normal breath sounds.  Abdominal:     General: Bowel sounds are normal.     Palpations: Abdomen is soft.  Neurological:     General: No focal deficit present.     Mental Status: She is alert and oriented to person, place, and time.         Assessment & Plan:   Problem List Items Addressed This Visit       Cardiovascular and Mediastinum   Primary hypertension - Primary     Blood pressure is upper end of normal so will continue to monitor.        Endocrine   Hypothyroidism     She will continue with levothyroxine and I will do TSH level next visit.        Other   Anticoagulation adequate     She is currently on Eliquis 5 mg twice a day for anticoagulation.      Mixed hyperlipidemia     She will continue with rosuvastatin 5 mg daily.  I will do lipid panel on  next visit.       Return in about 1 month (around 11/07/2024) for For PE please get record from [facility].   Roetta Dare, MD

## 2024-10-17 DIAGNOSIS — J189 Pneumonia, unspecified organism: Secondary | ICD-10-CM | POA: Insufficient documentation

## 2024-10-17 DIAGNOSIS — Z903 Acquired absence of stomach [part of]: Secondary | ICD-10-CM | POA: Insufficient documentation

## 2024-10-17 DIAGNOSIS — K5090 Crohn's disease, unspecified, without complications: Secondary | ICD-10-CM | POA: Insufficient documentation

## 2024-10-20 DIAGNOSIS — Z6841 Body Mass Index (BMI) 40.0 and over, adult: Secondary | ICD-10-CM | POA: Insufficient documentation

## 2024-10-22 DIAGNOSIS — R9389 Abnormal findings on diagnostic imaging of other specified body structures: Secondary | ICD-10-CM | POA: Insufficient documentation

## 2024-10-25 DIAGNOSIS — B449 Aspergillosis, unspecified: Secondary | ICD-10-CM | POA: Insufficient documentation

## 2024-10-25 DIAGNOSIS — I2699 Other pulmonary embolism without acute cor pulmonale: Secondary | ICD-10-CM | POA: Insufficient documentation

## 2024-11-08 ENCOUNTER — Encounter: Admitting: Internal Medicine

## 2024-11-21 LAB — LAB REPORT - SCANNED: EGFR: 52

## 2024-12-08 ENCOUNTER — Ambulatory Visit (HOSPITAL_BASED_OUTPATIENT_CLINIC_OR_DEPARTMENT_OTHER): Admitting: Family Medicine

## 2024-12-09 ENCOUNTER — Inpatient Hospital Stay

## 2024-12-09 ENCOUNTER — Inpatient Hospital Stay: Admitting: Oncology

## 2024-12-12 ENCOUNTER — Other Ambulatory Visit: Payer: Self-pay | Admitting: Oncology

## 2024-12-12 DIAGNOSIS — D6859 Other primary thrombophilia: Secondary | ICD-10-CM

## 2024-12-12 NOTE — Progress Notes (Deleted)
 " [facility] at [facility] [address] [place],  [place]  [address] [phone]  Clinic Day:  10/01/2024  Referring physician: Dorena Fernando HERO, MD   HISTORY OF PRESENT ILLNESS:  The patient is a 69 y.o. female who I recently began seeing as she was previously diagnosed with protein S deficiency.  Per labs in 2004, she was diagnosed with protein S deficiency at the time she had a DVT in her right popliteal vein. This led to her being placed on Coumadin, which she was on for decades. However, she  developed bilateral pulmonary emboli despite being on Coumadin and having an INR of 5.1.  This led to her being switched to Eliquis , which she continues to take at 5 mg twice daily.  Of note, although she has 12 siblings, many of whom have had either DVTs or pulmonary emboli, none of them has been diagnosed with protein S deficiency.  PHYSICAL EXAM:  DEFERRED   LABS:      Latest Ref Rng & Units 09/17/2024   11:36 AM  CBC  WBC 4.0 - 10.5 K/uL 6.5   Hemoglobin 12.0 - 15.0 g/dL 88.8   Hematocrit 63.9 - 46.0 % 35.3   Platelets 150 - 400 K/uL 446     Latest Reference Range & Units 09/17/24 11:36  Protein C-Functional 73 - 180 % 109  Protein C, Total 60 - 150 % 82  Protein S-Functional 63 - 140 % 84  Protein S, Free 61 - 136 % 101  Protein S, Total 60 - 150 % 99  --------------------------------------------------------------------------------------------------------------------- Protein S studies from January 2024:  Component 21 yr ago Comments  PROTEIN S FUNCTIONAL 30 L Unit: % of normal (NOTE)    Component Ref Range & Units 21 yr ago Comments  PROTEIN S, FREE % 50 Reference range: 50 to 120  PROTEIN S, TOTAL CANC Reference range: 50 to 120 (NOTE)   SCANS:  A Doppler ultrasound from 11-30-2002: The deep veins are satisfactorily demonstrated in the thigh with  satisfactory flow and Doppler  signal.  There is no evidence of deep vein thrombosis in the   superficial femoral vein or greater  saphenous vein.  A thrombus formation is noted in the right popliteal vein but there  is flow around the thrombus in  the popliteal vein.  The deep veins are compressible in the  superficial femoral vein or the greater  saphenous vein and the popliteal vein is also compressible, however.   The calf veins are  compressible and flow is noted.  IMPRESSION:  A short segment of thrombus formation is noted in the right popliteal  vein but the flow is noted  around the thrombus and the popliteal vein is somewhat compressible.   A similar finding was also  reported in the previous study of 10/17/02.  Other deep veins in the thigh and calf are unremarkable with flow  identified.    ASSESSMENT & PLAN:  A 69 y.o. female who I recently began seeing for apparently having protein S deficiency.  However, her recent protein S studies came back completely normal.    There is the possibility that the acute DVT she had at the time led to her lower-than-normal protein S levels.  Furthermore, there is uncertainty as to whether the patient was on warfarin at the time her protein S levels were done back in 2004.  In the latter half of 2003, the patient had multiple Doppler ultrasounds which showed a DVT in  her right popliteal vein.  Protein S levels usually are not affected by Eliquis .  To erase any shroud of uncertainty, her protein S levels will be rechecked in another 3 months.  If they come back normal at that time, then I likely will not consider her having protein S deficiency, particularly when no one else in her family has ever been diagnosed with this disorder.  Irrespective of what her protein S diagnosis status ends up being, the fact that she has had 2 different blood clots over her lifetime essentially relegates her to lifelong anticoagulation.  I will see this patient back in 3 months to go over her protein S studies and their implications. The patient understands  all the plans discussed today and is in agreement with them.  Allex Lapoint DELENA Kerns, MD       "

## 2024-12-13 ENCOUNTER — Other Ambulatory Visit: Payer: Self-pay | Admitting: Oncology

## 2024-12-13 ENCOUNTER — Inpatient Hospital Stay (HOSPITAL_BASED_OUTPATIENT_CLINIC_OR_DEPARTMENT_OTHER): Admitting: Oncology

## 2024-12-13 ENCOUNTER — Inpatient Hospital Stay: Attending: Oncology

## 2024-12-13 VITALS — BP 144/95 | HR 74 | Temp 98.3°F | Resp 14 | Ht 63.0 in | Wt 227.2 lb

## 2024-12-13 DIAGNOSIS — D6859 Other primary thrombophilia: Secondary | ICD-10-CM | POA: Insufficient documentation

## 2024-12-13 DIAGNOSIS — Z79899 Other long term (current) drug therapy: Secondary | ICD-10-CM | POA: Insufficient documentation

## 2024-12-13 DIAGNOSIS — I2699 Other pulmonary embolism without acute cor pulmonale: Secondary | ICD-10-CM

## 2024-12-13 DIAGNOSIS — D509 Iron deficiency anemia, unspecified: Secondary | ICD-10-CM

## 2024-12-13 DIAGNOSIS — Z9884 Bariatric surgery status: Secondary | ICD-10-CM | POA: Insufficient documentation

## 2024-12-13 DIAGNOSIS — K449 Diaphragmatic hernia without obstruction or gangrene: Secondary | ICD-10-CM | POA: Diagnosis not present

## 2024-12-13 DIAGNOSIS — T148XXA Other injury of unspecified body region, initial encounter: Secondary | ICD-10-CM

## 2024-12-13 DIAGNOSIS — Z86711 Personal history of pulmonary embolism: Secondary | ICD-10-CM | POA: Insufficient documentation

## 2024-12-13 DIAGNOSIS — Z86718 Personal history of other venous thrombosis and embolism: Secondary | ICD-10-CM | POA: Diagnosis not present

## 2024-12-13 DIAGNOSIS — Z7901 Long term (current) use of anticoagulants: Secondary | ICD-10-CM | POA: Diagnosis not present

## 2024-12-13 LAB — CBC WITH DIFFERENTIAL (CANCER CENTER ONLY)
Abs Immature Granulocytes: 0.01 K/uL (ref 0.00–0.07)
Basophils Absolute: 0.1 K/uL (ref 0.0–0.1)
Basophils Relative: 2 %
Eosinophils Absolute: 0.1 K/uL (ref 0.0–0.5)
Eosinophils Relative: 2 %
HCT: 32.6 % — ABNORMAL LOW (ref 36.0–46.0)
Hemoglobin: 9.8 g/dL — ABNORMAL LOW (ref 12.0–15.0)
Immature Granulocytes: 0 %
Lymphocytes Relative: 31 %
Lymphs Abs: 1.5 K/uL (ref 0.7–4.0)
MCH: 27.1 pg (ref 26.0–34.0)
MCHC: 30.1 g/dL (ref 30.0–36.0)
MCV: 90.1 fL (ref 80.0–100.0)
Monocytes Absolute: 0.5 K/uL (ref 0.1–1.0)
Monocytes Relative: 10 %
Neutro Abs: 2.7 K/uL (ref 1.7–7.7)
Neutrophils Relative %: 55 %
Platelet Count: 664 K/uL — ABNORMAL HIGH (ref 150–400)
RBC: 3.62 MIL/uL — ABNORMAL LOW (ref 3.87–5.11)
RDW: 17.2 % — ABNORMAL HIGH (ref 11.5–15.5)
WBC Count: 4.9 K/uL (ref 4.0–10.5)
nRBC: 0 % (ref 0.0–0.2)

## 2024-12-13 LAB — CMP (CANCER CENTER ONLY)
ALT: 32 U/L (ref 0–44)
AST: 69 U/L — ABNORMAL HIGH (ref 15–41)
Albumin: 4.1 g/dL (ref 3.5–5.0)
Alkaline Phosphatase: 192 U/L — ABNORMAL HIGH (ref 38–126)
Anion gap: 10 (ref 5–15)
BUN: 12 mg/dL (ref 8–23)
CO2: 27 mmol/L (ref 22–32)
Calcium: 9.4 mg/dL (ref 8.9–10.3)
Chloride: 105 mmol/L (ref 98–111)
Creatinine: 0.93 mg/dL (ref 0.44–1.00)
GFR, Estimated: >60 mL/min
Glucose, Bld: 165 mg/dL — ABNORMAL HIGH (ref 70–99)
Potassium: 3.3 mmol/L — ABNORMAL LOW (ref 3.5–5.1)
Sodium: 142 mmol/L (ref 135–145)
Total Bilirubin: 0.5 mg/dL (ref 0.0–1.2)
Total Protein: 6.8 g/dL (ref 6.5–8.1)

## 2024-12-13 LAB — FERRITIN: Ferritin: 121 ng/mL (ref 11–307)

## 2024-12-13 LAB — IRON AND TIBC
Iron: 41 ug/dL (ref 28–170)
Saturation Ratios: 10 % — ABNORMAL LOW (ref 10.4–31.8)
TIBC: 427 ug/dL (ref 250–450)
UIBC: 386 ug/dL

## 2024-12-14 ENCOUNTER — Telehealth: Payer: Self-pay | Admitting: Oncology

## 2024-12-14 ENCOUNTER — Encounter: Payer: Self-pay | Admitting: Oncology

## 2024-12-14 DIAGNOSIS — D509 Iron deficiency anemia, unspecified: Secondary | ICD-10-CM | POA: Insufficient documentation

## 2024-12-14 LAB — PROTEIN C ACTIVITY: Protein C Activity: 129 % (ref 73–180)

## 2024-12-14 LAB — PROTEIN S, ANTIGEN, FREE: Protein S Ag, Free: 106 % (ref 61–136)

## 2024-12-14 LAB — PROTEIN S, TOTAL: Protein S Ag, Total: 147 % (ref 60–150)

## 2024-12-14 LAB — PROTEIN S ACTIVITY: Protein S Activity: 73 % (ref 63–140)

## 2024-12-14 NOTE — Telephone Encounter (Signed)
 Patient has been scheduled for follow-up visit per 12/14/2024 LOS.  LVM notifying pt of appt details, provided my direct number to pt if appt changes need to be made.

## 2024-12-15 LAB — PROTEIN C, TOTAL: Protein C, Total: 94 % (ref 60–150)

## 2024-12-16 ENCOUNTER — Encounter: Payer: Self-pay | Admitting: Oncology

## 2024-12-17 ENCOUNTER — Inpatient Hospital Stay

## 2024-12-17 VITALS — BP 138/90 | HR 74 | Temp 98.2°F | Resp 18

## 2024-12-17 DIAGNOSIS — D6859 Other primary thrombophilia: Secondary | ICD-10-CM | POA: Diagnosis not present

## 2024-12-17 DIAGNOSIS — D509 Iron deficiency anemia, unspecified: Secondary | ICD-10-CM

## 2024-12-17 MED ORDER — SODIUM CHLORIDE 0.9 % IV SOLN
510.0000 mg | Freq: Once | INTRAVENOUS | Status: AC
  Administered 2024-12-17: 510 mg via INTRAVENOUS
  Filled 2024-12-17: qty 510

## 2024-12-17 MED ORDER — ACETAMINOPHEN 325 MG PO TABS
650.0000 mg | ORAL_TABLET | Freq: Once | ORAL | Status: AC
  Administered 2024-12-17: 650 mg via ORAL
  Filled 2024-12-17: qty 2

## 2024-12-17 MED ORDER — SODIUM CHLORIDE 0.9 % IV SOLN: INTRAVENOUS | Status: DC

## 2024-12-17 MED ORDER — LORATADINE 10 MG PO TABS
10.0000 mg | ORAL_TABLET | Freq: Once | ORAL | Status: AC
  Administered 2024-12-17: 10 mg via ORAL
  Filled 2024-12-17: qty 1

## 2024-12-17 NOTE — Patient Instructions (Signed)

## 2024-12-20 ENCOUNTER — Other Ambulatory Visit: Payer: Self-pay

## 2024-12-20 ENCOUNTER — Other Ambulatory Visit: Payer: Self-pay | Admitting: Hematology and Oncology

## 2024-12-20 ENCOUNTER — Other Ambulatory Visit: Payer: Self-pay | Admitting: Oncology

## 2024-12-20 MED ORDER — ENOXAPARIN SODIUM 40 MG/0.4ML IJ SOSY: 40.0000 mg | PREFILLED_SYRINGE | Freq: Two times a day (BID) | INTRAMUSCULAR | 1 refills | Status: AC

## 2024-12-20 MED ORDER — ENOXAPARIN SODIUM 40 MG/0.4ML IJ SOSY: 40.0000 mg | PREFILLED_SYRINGE | Freq: Two times a day (BID) | INTRAMUSCULAR | 1 refills | Status: DC

## 2024-12-22 ENCOUNTER — Inpatient Hospital Stay

## 2024-12-22 ENCOUNTER — Encounter (HOSPITAL_BASED_OUTPATIENT_CLINIC_OR_DEPARTMENT_OTHER): Payer: Self-pay | Admitting: Family Medicine

## 2024-12-22 ENCOUNTER — Ambulatory Visit (INDEPENDENT_AMBULATORY_CARE_PROVIDER_SITE_OTHER): Admitting: Family Medicine

## 2024-12-22 VITALS — BP 130/70 | HR 60 | Temp 98.1°F | Resp 16

## 2024-12-22 VITALS — BP 93/66 | HR 109 | Temp 98.3°F | Resp 16 | Ht 62.99 in | Wt 226.9 lb

## 2024-12-22 DIAGNOSIS — D510 Vitamin B12 deficiency anemia due to intrinsic factor deficiency: Secondary | ICD-10-CM | POA: Insufficient documentation

## 2024-12-22 DIAGNOSIS — D509 Iron deficiency anemia, unspecified: Secondary | ICD-10-CM

## 2024-12-22 DIAGNOSIS — D6859 Other primary thrombophilia: Secondary | ICD-10-CM | POA: Diagnosis not present

## 2024-12-22 DIAGNOSIS — F331 Major depressive disorder, recurrent, moderate: Secondary | ICD-10-CM | POA: Diagnosis not present

## 2024-12-22 DIAGNOSIS — I2510 Atherosclerotic heart disease of native coronary artery without angina pectoris: Secondary | ICD-10-CM

## 2024-12-22 DIAGNOSIS — R413 Other amnesia: Secondary | ICD-10-CM | POA: Insufficient documentation

## 2024-12-22 MED ORDER — SODIUM CHLORIDE 0.9 % IV SOLN: INTRAVENOUS | Status: DC

## 2024-12-22 MED ORDER — LORATADINE 10 MG PO TABS
10.0000 mg | ORAL_TABLET | Freq: Once | ORAL | Status: AC
  Administered 2024-12-22: 10 mg via ORAL
  Filled 2024-12-22: qty 1

## 2024-12-22 MED ORDER — SODIUM CHLORIDE 0.9 % IV SOLN
510.0000 mg | Freq: Once | INTRAVENOUS | Status: AC
  Administered 2024-12-22: 510 mg via INTRAVENOUS
  Filled 2024-12-22: qty 510

## 2024-12-22 MED ORDER — ACETAMINOPHEN 325 MG PO TABS
650.0000 mg | ORAL_TABLET | Freq: Once | ORAL | Status: AC
  Administered 2024-12-22: 650 mg via ORAL
  Filled 2024-12-22: qty 2

## 2024-12-22 MED ORDER — CYANOCOBALAMIN 1000 MCG/ML IJ SOLN
1000.0000 ug | Freq: Once | INTRAMUSCULAR | Status: AC
  Administered 2024-12-22: 1000 ug via INTRAMUSCULAR

## 2024-12-22 NOTE — Progress Notes (Signed)
 "  New Patient Office Visit  Subjective    Patient ID: Susan Perry, female    DOB: 1956/05/20  Age: 69 y.o. MRN: [id number]  CC:  Chief Complaint  Patient presents with   Establish Care    Establish Care     Discussed the use of AI scribe software for clinical note transcription with the patient, who gave verbal consent to proceed.  History of Present Illness Susan Perry is a 69 year old female who presents for follow-up after recent hospitalization for a Pneumonia at [facility].  She is accompanied by her daughter, who is actively involved in her care.  I was able to find some useful records.  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 She was treated with antifungal medication as she was not strong enough for surgery initially. The infection led to calcification of part of her left lung. After multiple surgical attempts, the fistula was successfully closed.  Following her discharge on December 16th, she experienced a large hematoma that required emergency intervention on December 22nd due to loss of pulse and inability to walk. The hematoma was pressing against her bladder, causing displacement and swelling. She is currently on a reduced dose of Lovenox . She previously used Coumadin and Eliquis , but these medications interfered with her antifungal treatment.  She has a history of weight loss surgery in 2018, which resulted in complications due to alleged surgical negligence, leading to multiple hospitalizations and infections. 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  She has a history of Crohn's disease and is under the care of specialists at [facility]. She also has a B12 deficiency, which is pertinent to her memory and energy levels, and she is not currently taking B12 supplements.  Her social history  includes a past smoking history of half a pack per day for approximately 20 years, quitting in 2000. She is retired from [employer], divorced, and lives with two friends who assist with her care. She has a history of depression, which is managed with medication, and she experiences memory issues, which have worsened since her recent hospital stay.  She has a history of kidney failure, stage 3, and vitamin D deficiency, but is not currently seeing a nephrologist. She also has a history of moderate and mild heart blockages.    Outpatient Encounter Medications as of 12/22/2024  Medication Sig   albuterol (VENTOLIN HFA) 108 (90 Base) MCG/ACT inhaler Inhale into the lungs every 6 (six) hours as needed for wheezing or shortness of breath.   amoxicillin-clavulanate (AUGMENTIN) 400-57 MG/5ML suspension Take 875 mg by mouth.   baclofen (LIORESAL) 20 MG tablet Take 20 mg by mouth 3 (three) times daily.   buPROPion (WELLBUTRIN XL) 300 MG 24 hr tablet Take 300 mg by mouth daily.   dexlansoprazole (DEXILANT) 60 MG capsule Take 60 mg by mouth daily.   donepezil (ARICEPT) 5 MG tablet Take 5 mg by mouth at bedtime.   enoxaparin  (LOVENOX ) 40 MG/0.4ML injection Inject 0.4 mLs (40 mg total) into the skin every 12 (twelve) hours.   escitalopram (LEXAPRO) 20 MG tablet Take 20 mg by mouth daily.   famotidine (PEPCID) 40 MG tablet Take 40 mg by mouth daily.   FARXIGA 10 MG TABS tablet Take 10 mg by mouth daily.   HYDROcodone-acetaminophen  (NORCO) 10-325 MG tablet Take 1-2  tablets by mouth every 4 (four) hours as needed.    hydrOXYzine (ATARAX/VISTARIL) 50 MG tablet Take 50 mg by mouth daily as needed.    levothyroxine (SYNTHROID) 112 MCG tablet Take 112 mcg by mouth daily.   LINZESS 72 MCG capsule Take 72 mcg by mouth daily before breakfast.    losartan-hydrochlorothiazide (HYZAAR) 100-25 MG tablet 1 tablet daily.    metroNIDAZOLE (FLAGYL) 500 MG tablet Take 500 mg by mouth 3 (three) times daily.    montelukast (SINGULAIR) 10 MG tablet Take 10 mg by mouth at bedtime.   NARCAN 4 MG/0.1ML LIQD nasal spray kit Place 1 spray into the nose.    nitroGLYCERIN  (NITROSTAT ) 0.4 MG SL tablet Place 0.4 mg under the tongue every 5 (five) minutes as needed for chest pain.   oxyCODONE-acetaminophen  (PERCOCET) 7.5-325 MG tablet Take 1 tablet by mouth 4 (four) times daily.   rosuvastatin (CRESTOR) 5 MG tablet Take 5 mg by mouth daily.   SUMAtriptan (IMITREX) 100 MG tablet Take 100 mg by mouth as needed.   valACYclovir (VALTREX) 1000 MG tablet as needed. For outbreaks.   voriconazole (VFEND) 200 MG tablet Take 200 mg by mouth.   warfarin (COUMADIN) 5 MG tablet Take 15 mg by mouth daily.   [DISCONTINUED] levothyroxine (SYNTHROID, LEVOTHROID) 75 MCG tablet Take 225 mcg by mouth daily before breakfast.    Facility-Administered Encounter Medications as of 12/22/2024  Medication   cyanocobalamin  (VITAMIN B12) injection 1,000 mcg    Past Medical History:  Diagnosis Date   CAD (coronary artery disease)    f/by Cardiology   CKD (chronic kidney disease), stage III (HCC)    Crohn's disease (HCC)    f/by [facility] GI   Deep vein thrombosis (DVT) (HCC)    Dyslipidemia    Former smoker    10 pack year history before quitting in 2000   Gastric fistula    (history of) f/by [facility] Surgery   GERD (gastroesophageal reflux disease)    History of aspergillosis    f/by [facility] ID   History of GI bleed    History of noncompliance with medical treatment    History of pelvic hematoma    f/by [facility] Urology   History of pneumonia    f/by [facility] Pulmonology   History of pulmonary embolism    Hypertension    Hypothyroid    Iron deficiency anemia    f/by Dr. Ezzard   Major depressive disorder, recurrent episode, moderate (HCC)    Morbid obesity (HCC)    Pernicious anemia    Protein S deficiency    known to Dr. Ezzard    Past Surgical History:  Procedure Laterality Date   ABDOMINAL HYSTERECTOMY     CHOLECYSTECTOMY OPEN      IVC FILTER INSERTION     PLEURECTOMY WITH DECORTICATION     SLEEVE GASTROPLASTY     TUBAL LIGATION     VEIN LIGATION AND STRIPPING Left     Family History  Problem Relation Age of Onset   Congestive Heart Failure Mother    Hypertension Mother    Diabetes type II Mother    Cerebral aneurysm Mother    COPD Father    Prostate cancer Father    Breast cancer Sister    Deep vein thrombosis Sister    Ulcers Sister    Epilepsy Sister    Schizophrenia Sister    Hypertension Sister    Macular degeneration Sister    Atrial fibrillation Sister    Heart disease Sister  Crohn's disease Sister    Deep vein thrombosis Sister    Deep vein thrombosis Sister    Hypertension Sister    Crohn's disease Sister    Breast cancer Sister    Skin cancer Brother    Diabetes Brother    Hypertension Brother    Stroke Brother    Skin cancer Brother    Lymphoma Brother    Other Brother        PROGRESSIVE SUPRANUCLEAR PALSY - PRIMARY PROGRESSIVE APHASIA   Dementia Brother    Lymphoma Maternal Grandmother     Social History[1]  Review of Systems  Constitutional:  Positive for malaise/fatigue. Negative for diaphoresis, fever and weight loss.  Respiratory:  Negative for cough, shortness of breath and wheezing.   Cardiovascular:  Negative for chest pain, palpitations, orthopnea, claudication, leg swelling and PND.        Objective    BP 93/66 (BP Location: Right Arm, Patient Position: Standing, Cuff Size: Normal)   Pulse (!) 109   Temp 98.3 F (36.8 C) (Oral)   Resp 16   Ht 5' 2.99 (1.6 m)   Wt 226 lb 14.4 oz (102.9 kg)   SpO2 95%   BMI 40.20 kg/m   Physical Exam Constitutional:      General: She is not in acute distress.    Appearance: Normal appearance. She is obese.     Comments: Pleasant, chronically ill appearing  HENT:     Head: Normocephalic.  Neck:     Vascular: No carotid bruit.  Cardiovascular:     Rate and Rhythm: Normal rate and regular rhythm.     Pulses: Normal  pulses.     Heart sounds: Normal heart sounds.  Pulmonary:     Effort: Pulmonary effort is normal.     Breath sounds: Normal breath sounds.  Abdominal:     General: Bowel sounds are normal.     Palpations: Abdomen is soft. There is no mass.     Tenderness: There is no abdominal tenderness.     Comments: No open wounds.  Musculoskeletal:     Cervical back: Neck supple. No tenderness.     Right lower leg: No edema.     Left lower leg: No edema.  Neurological:     General: No focal deficit present.     Mental Status: She is alert.  Psychiatric:        Mood and Affect: Mood normal.        Behavior: Behavior normal.         Assessment & Plan:   Assessment & Plan Memory loss Long-standing memory issues, worsened post-hospitalization. Advised that her B12 deficiency may contribute to memory problems, and that she needs to be compliant with this. - Administered B12 injection today - Recommended four B12 injections over a month, one per week - Continue monthly B12 injections after initial four-week course - She and her daughter also acknowledge that she has been occasionally noncompliant with her antidepressant in the past.  She seems to understand that uncontrolled depression can lead to memory loss and she must remain compliant.  Will evaluate in greater depth over time as I get to know her.    Pernicious anemia Vitamin B12 deficiency likely contributing to memory issues and fatigue. No current supplementation. - Administered B12 injection today - Recommended four B12 injections over a month, one per week - Continue monthly B12 injections after initial four-week course Orders:   cyanocobalamin  (VITAMIN B12) injection 1,000 mcg  Major  depressive disorder, recurrent episode, moderate (HCC) Depression managed with citalopram. Symptoms worsen if medication is missed. - I urged compliance as above.    Coronary artery disease involving native heart without angina pectoris,  unspecified vessel or lesion type No current cardiologist due to previous provider change. - Referred to Dr. Krasowski for cardiology follow up. Orders:   Ambulatory referral to Cardiology     Return in about 4 weeks (around 01/19/2025) for chronic follow-up.   REDDING PONCE NORLEEN FALCON., MD      [1]  Social History Tobacco Use   Smoking status: Former    Types: Cigarettes   Smokeless tobacco: Never  Vaping Use   Vaping status: Never Used  Substance Use Topics   Alcohol use: No   Drug use: No   "

## 2024-12-22 NOTE — Assessment & Plan Note (Addendum)
 Long-standing memory issues, worsened post-hospitalization. Advised that her B12 deficiency may contribute to memory problems, and that she needs to be compliant with this. - Administered B12 injection today - Recommended four B12 injections over a month, one per week - Continue monthly B12 injections after initial four-week course - She and her daughter also acknowledge that she has been occasionally noncompliant with her antidepressant in the past.  She seems to understand that uncontrolled depression can lead to memory loss and she must remain compliant.  Will evaluate in greater depth over time as I get to know her.

## 2024-12-22 NOTE — Progress Notes (Signed)
 Patient is in office today for a nurse visit for B12 Injection. Patient Injection was given in the  Right deltoid. Patient tolerated injection well.

## 2024-12-22 NOTE — Assessment & Plan Note (Signed)
 Vitamin B12 deficiency likely contributing to memory issues and fatigue. No current supplementation. - Administered B12 injection today - Recommended four B12 injections over a month, one per week - Continue monthly B12 injections after initial four-week course Orders:   cyanocobalamin  (VITAMIN B12) injection 1,000 mcg

## 2024-12-22 NOTE — Assessment & Plan Note (Addendum)
 Depression managed with citalopram. Symptoms worsen if medication is missed. - I urged compliance as above.

## 2024-12-27 ENCOUNTER — Encounter: Payer: Self-pay | Admitting: Oncology

## 2024-12-27 ENCOUNTER — Inpatient Hospital Stay

## 2024-12-27 NOTE — Progress Notes (Signed)
 " [facility] [address] [place],  [place]  [address] [phone]  Clinic Day:  12/09/2024  Referring physician: Dorena Fernando HERO, MD   HISTORY OF PRESENT ILLNESS:  The patient is a 69 y.o. female who I follow as she was previously diagnosed with protein S deficiency.  Per labs in 2004, she was diagnosed with protein S deficiency at the time she had a DVT in her right popliteal vein. This led to her being placed on Coumadin, which she was on for decades. However, she recently developed bilateral pulmonary emboli despite being on Coumadin and having an INR of 5.1.  This led to her being switched to Eliquis  5 mg BID.  The patient comes in today as she has had a very complicated last few months.  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  An attempt was made to repair it, but was unsuccessful.  During that hospitalization, scans showed a pulmonary embolus, for which she was placed on Lovenox .    In December 2025, her gastropleural fistula was ultimately resected.  During that same time, a paraesophageal hernia repair was done.  The patient was still on placed on Lovenox  for her recent pulmonary embolus.  However, she returned to the hospital 1 week later after discharge with severe abdominal pain.  A rectus sheath hematoma was found, for which its source (inferior epigastric artery) was ablated.  After this was stabilized, the patient was placed back on Lovenox  40 mg BID.  During that hospitalization, she was given multiple units of blood to replace what was lost with her hematoma.    Since being discharged in late December 2025, the patient has been doing fine.  She has had no further bleeding or clotting problems since she was placed back on her Lovenox .  PHYSICAL EXAM:  Blood pressure (!) 144/95, pulse 74, temperature 98.3 F (36.8 C), temperature  source Oral, resp. rate 14, height 5' 3 (1.6 m), weight 227 lb 3.2 oz (103.1 kg), SpO2 98%. Wt Readings from Last 3 Encounters:  12/22/24 226 lb 14.4 oz (102.9 kg)  12/13/24 227 lb 3.2 oz (103.1 kg)  10/08/24 247 lb 8 oz (112.3 kg)   Body mass index is 40.25 kg/m. Performance status (ECOG): 1 - Symptomatic but completely ambulatory Physical Exam Constitutional:      Appearance: Normal appearance. She is not ill-appearing.  HENT:     Mouth/Throat:     Mouth: Mucous membranes are moist.     Pharynx: Oropharynx is clear. No oropharyngeal exudate or posterior oropharyngeal erythema.  Cardiovascular:     Rate and Rhythm: Normal rate and regular rhythm.     Heart sounds: No murmur heard.    No friction rub. No gallop.  Pulmonary:     Effort: Pulmonary effort is normal. No respiratory distress.     Breath sounds: Normal breath sounds. No wheezing, rhonchi or rales.  Abdominal:     General: Bowel sounds are normal. There is no distension.     Palpations: Abdomen is soft. There is no mass.     Tenderness: There is no abdominal tenderness.  Musculoskeletal:        General: No swelling.     Right lower leg: No edema.     Left lower leg: No edema.  Lymphadenopathy:     Cervical: No cervical adenopathy.     Upper Body:  Right upper body: No supraclavicular or axillary adenopathy.     Left upper body: No supraclavicular or axillary adenopathy.     Lower Body: No right inguinal adenopathy. No left inguinal adenopathy.  Skin:    General: Skin is warm.     Coloration: Skin is not jaundiced.     Findings: No lesion or rash.  Neurological:     General: No focal deficit present.     Mental Status: She is alert and oriented to person, place, and time. Mental status is at baseline.  Psychiatric:        Mood and Affect: Mood normal.        Behavior: Behavior normal.        Thought Content: Thought content normal.     LABS:   Component Ref Range & Units 2 wk ago  WBC 4.40 - 11.00  10*3/uL 6.60  RBC 4.10 - 5.10 10*6/uL 3.86 Low   Hemoglobin 12.3 - 15.3 g/dL 89.5 Low   Hematocrit 64.0 - 44.6 % 32.7 Low   Mean Corpuscular Volume (MCV) 80.0 - 96.0 fL 84.8  Mean Corpuscular Hemoglobin (MCH) 27.5 - 33.2 pg 26.9 Low   Mean Corpuscular Hemoglobin Conc (MCHC) 33.0 - 37.0 g/dL 68.2 Low   Red Cell Distribution Width (RDW) 12.3 - 17.0 % 17.8 High   Platelet Count (PLT) 150 - 450 10*3/uL 940 High   Mean Platelet Volume (MPV) 6.8 - 10.2 fL 8.2  Neutrophils % % 55  Lymphocytes % % 31  Monocytes % % 9  Eosinophils % % 2  Basophils % % 3  nRBC % % 0  Neutrophils Absolute 1.80 - 7.80 10*3/uL 3.60  Lymphocytes # 1.00 - 4.80 10*3/uL 2.10  Monocytes # 0.00 - 0.80 10*3/uL 0.60  Eosinophils # 0.00 - 0.50 10*3/uL 0.10  Basophils # 0.00 - 0.20 10*3/uL 0.20  nRBC Absolute <=0.00 10*3/uL 0.00  Resulting Agency [facility](CLIA# [id number])   Specimen Collected: 12/09/24 15:31     Latest Reference Range & Units 09/17/24 11:36  Protein C-Functional 73 - 180 % 109  Protein C, Total 60 - 150 % 82  Protein S-Functional 63 - 140 % 84  Protein S, Free 61 - 136 % 101  Protein S, Total 60 - 150 % 99  ----------------------------------------------------------------------------------------------------------------- Protein S studies from January 2004:   Component 21 yr ago Comments  PROTEIN S FUNCTIONAL 30 L Unit: % of normal (NOTE)      Component Ref Range & Units 21 yr ago Comments  PROTEIN S, FREE % 50 Reference range: 50 to 120  PROTEIN S, TOTAL CANC Reference range: 50 to 120 (NOTE)   ASSESSMENT & PLAN:  Assessment/Plan:  A 69 y.o. 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  Of note labs in October 2005 showed no evidence of protein S deficiency.  This is not particularly surprising when considering none of her other family members share this diagnosis.  Despite this, as she has had multiple pulmonary  emboli during her lifetime, she will remain on Lovenox  indefinitely.   As she has had recent anemia from her rectus sheath hematoma, her CBC will be rechecked next week to ensure no drop has occurred.  Clinically, she appears to be doing okay despite having multiple medical problems recently.  I will see her back in 4 months for repeat clinical assessment.  The patient understands all the plans discussed today and is in agreement with them.  Daeveon Zweber DELENA Kerns, MD       "

## 2024-12-29 ENCOUNTER — Ambulatory Visit (HOSPITAL_BASED_OUTPATIENT_CLINIC_OR_DEPARTMENT_OTHER): Admitting: *Deleted

## 2024-12-29 DIAGNOSIS — D510 Vitamin B12 deficiency anemia due to intrinsic factor deficiency: Secondary | ICD-10-CM

## 2024-12-29 MED ORDER — CYANOCOBALAMIN 1000 MCG/ML IJ SOLN
1000.0000 ug | Freq: Once | INTRAMUSCULAR | Status: AC
  Administered 2024-12-29: 1000 ug via INTRAMUSCULAR

## 2024-12-31 ENCOUNTER — Inpatient Hospital Stay: Admitting: Oncology

## 2024-12-31 DIAGNOSIS — Z91199 Patient's noncompliance with other medical treatment and regimen due to unspecified reason: Secondary | ICD-10-CM | POA: Insufficient documentation

## 2024-12-31 DIAGNOSIS — Z87891 Personal history of nicotine dependence: Secondary | ICD-10-CM | POA: Insufficient documentation

## 2024-12-31 DIAGNOSIS — N183 Chronic kidney disease, stage 3 unspecified: Secondary | ICD-10-CM | POA: Insufficient documentation

## 2024-12-31 DIAGNOSIS — Z86711 Personal history of pulmonary embolism: Secondary | ICD-10-CM | POA: Insufficient documentation

## 2024-12-31 DIAGNOSIS — K219 Gastro-esophageal reflux disease without esophagitis: Secondary | ICD-10-CM | POA: Insufficient documentation

## 2024-12-31 DIAGNOSIS — E785 Hyperlipidemia, unspecified: Secondary | ICD-10-CM | POA: Insufficient documentation

## 2024-12-31 DIAGNOSIS — K316 Fistula of stomach and duodenum: Secondary | ICD-10-CM | POA: Insufficient documentation

## 2024-12-31 DIAGNOSIS — I2510 Atherosclerotic heart disease of native coronary artery without angina pectoris: Secondary | ICD-10-CM | POA: Insufficient documentation

## 2024-12-31 DIAGNOSIS — Z8619 Personal history of other infectious and parasitic diseases: Secondary | ICD-10-CM | POA: Insufficient documentation

## 2024-12-31 DIAGNOSIS — I82409 Acute embolism and thrombosis of unspecified deep veins of unspecified lower extremity: Secondary | ICD-10-CM | POA: Insufficient documentation

## 2024-12-31 DIAGNOSIS — Z8719 Personal history of other diseases of the digestive system: Secondary | ICD-10-CM | POA: Insufficient documentation

## 2025-01-04 ENCOUNTER — Encounter: Payer: Self-pay | Admitting: Cardiology

## 2025-01-04 ENCOUNTER — Ambulatory Visit: Admitting: Cardiology

## 2025-01-04 VITALS — BP 138/82 | HR 79 | Ht 63.0 in | Wt 228.2 lb

## 2025-01-04 DIAGNOSIS — I25118 Atherosclerotic heart disease of native coronary artery with other forms of angina pectoris: Secondary | ICD-10-CM | POA: Diagnosis not present

## 2025-01-04 DIAGNOSIS — N183 Chronic kidney disease, stage 3 unspecified: Secondary | ICD-10-CM

## 2025-01-04 DIAGNOSIS — I825Z9 Chronic embolism and thrombosis of unspecified deep veins of unspecified distal lower extremity: Secondary | ICD-10-CM | POA: Diagnosis not present

## 2025-01-04 DIAGNOSIS — E782 Mixed hyperlipidemia: Secondary | ICD-10-CM | POA: Diagnosis not present

## 2025-01-04 DIAGNOSIS — Z01810 Encounter for preprocedural cardiovascular examination: Secondary | ICD-10-CM | POA: Diagnosis not present

## 2025-01-04 DIAGNOSIS — I10 Essential (primary) hypertension: Secondary | ICD-10-CM | POA: Diagnosis not present

## 2025-01-04 DIAGNOSIS — Z95828 Presence of other vascular implants and grafts: Secondary | ICD-10-CM

## 2025-01-04 NOTE — Patient Instructions (Signed)

## 2025-01-06 ENCOUNTER — Telehealth: Payer: Self-pay | Admitting: Cardiology

## 2025-01-06 ENCOUNTER — Other Ambulatory Visit (HOSPITAL_BASED_OUTPATIENT_CLINIC_OR_DEPARTMENT_OTHER): Payer: Self-pay | Admitting: *Deleted

## 2025-01-06 NOTE — Telephone Encounter (Signed)
 Pt c/o medication issue:  1. Name of Medication: rosuvastatin (CRESTOR) 5 MG tablet   2. How are you currently taking this medication (dosage and times per day)? On hold   3. Are you having a reaction (difficulty breathing--STAT)? No   4. What is your medication issue? Courtney with [facility] calling in regard to pt restarting Crestor. Medication was discontinued due to medication interaction voriconazole, but they feel pt can restart medication at 5 mg as long as she is monitored closely for rhabdomyolysis.   Callback [phone]

## 2025-01-06 NOTE — Progress Notes (Unsigned)
 Patient is in office today for a nurse visit for B12 Injection. Patient Injection was given in the  Right deltoid. Patient tolerated injection well.

## 2025-01-07 ENCOUNTER — Other Ambulatory Visit: Payer: Self-pay

## 2025-01-07 ENCOUNTER — Ambulatory Visit (HOSPITAL_BASED_OUTPATIENT_CLINIC_OR_DEPARTMENT_OTHER)

## 2025-01-07 DIAGNOSIS — D510 Vitamin B12 deficiency anemia due to intrinsic factor deficiency: Secondary | ICD-10-CM

## 2025-01-07 MED ORDER — CYANOCOBALAMIN 1000 MCG/ML IJ SOLN
1000.0000 ug | Freq: Once | INTRAMUSCULAR | Status: AC
  Administered 2025-01-07: 1000 ug via INTRAMUSCULAR

## 2025-01-07 NOTE — Telephone Encounter (Signed)
 Left message for the patient to call back.

## 2025-01-07 NOTE — Progress Notes (Unsigned)
 Patient is in office today for a nurse visit for B12 Injection. Patient Injection was given in the  Left deltoid. Patient tolerated injection well.

## 2025-01-14 ENCOUNTER — Ambulatory Visit (HOSPITAL_BASED_OUTPATIENT_CLINIC_OR_DEPARTMENT_OTHER)

## 2025-01-19 ENCOUNTER — Ambulatory Visit (HOSPITAL_BASED_OUTPATIENT_CLINIC_OR_DEPARTMENT_OTHER): Admitting: Family Medicine

## 2025-02-10 ENCOUNTER — Inpatient Hospital Stay

## 2025-02-10 ENCOUNTER — Inpatient Hospital Stay: Admitting: Oncology
